# Patient Record
Sex: Male | Born: 1970 | Race: White | Hispanic: No | Smoking: Former smoker
Health system: Southern US, Community
[De-identification: ages and names within clinical notes are randomized; demographics above are authoritative.]

## PROBLEM LIST (undated history)

## (undated) DIAGNOSIS — J309 Allergic rhinitis, unspecified: Secondary | ICD-10-CM

## (undated) DIAGNOSIS — K4090 Unilateral inguinal hernia, without obstruction or gangrene, not specified as recurrent: Secondary | ICD-10-CM

## (undated) DIAGNOSIS — F17200 Nicotine dependence, unspecified, uncomplicated: Secondary | ICD-10-CM

## (undated) DIAGNOSIS — J45909 Unspecified asthma, uncomplicated: Secondary | ICD-10-CM

## (undated) DIAGNOSIS — J40 Bronchitis, not specified as acute or chronic: Secondary | ICD-10-CM

## (undated) DIAGNOSIS — J45990 Exercise induced bronchospasm: Secondary | ICD-10-CM

## (undated) HISTORY — DX: Nicotine dependence, unspecified, uncomplicated: F17.200

## (undated) HISTORY — DX: Unilateral inguinal hernia, without obstruction or gangrene, not specified as recurrent: K40.90

## (undated) HISTORY — DX: Unspecified asthma, uncomplicated: J45.909

## (undated) HISTORY — DX: Allergic rhinitis, unspecified: J30.9

## (undated) HISTORY — DX: Bronchitis, not specified as acute or chronic: J40

## (undated) HISTORY — PX: HERNIA REPAIR: SHX51

## (undated) HISTORY — DX: Exercise induced bronchospasm: J45.990

---

## 1998-03-07 ENCOUNTER — Encounter: Admission: RE | Admit: 1998-03-07 | Discharge: 1998-03-07 | Payer: Self-pay | Admitting: Family Medicine

## 1998-07-13 ENCOUNTER — Encounter: Admission: RE | Admit: 1998-07-13 | Discharge: 1998-07-13 | Payer: Self-pay | Admitting: Family Medicine

## 1999-09-30 ENCOUNTER — Encounter: Admission: RE | Admit: 1999-09-30 | Discharge: 1999-09-30 | Payer: Self-pay | Admitting: Family Medicine

## 1999-09-30 ENCOUNTER — Ambulatory Visit (HOSPITAL_COMMUNITY): Admission: RE | Admit: 1999-09-30 | Discharge: 1999-09-30 | Payer: Self-pay | Admitting: Family Medicine

## 2000-03-02 ENCOUNTER — Encounter: Admission: RE | Admit: 2000-03-02 | Discharge: 2000-03-02 | Payer: Self-pay | Admitting: Family Medicine

## 2000-07-10 ENCOUNTER — Encounter: Admission: RE | Admit: 2000-07-10 | Discharge: 2000-07-10 | Payer: Self-pay | Admitting: Family Medicine

## 2000-08-18 ENCOUNTER — Encounter: Admission: RE | Admit: 2000-08-18 | Discharge: 2000-08-18 | Payer: Self-pay | Admitting: Family Medicine

## 2000-09-04 ENCOUNTER — Emergency Department (HOSPITAL_COMMUNITY): Admission: EM | Admit: 2000-09-04 | Discharge: 2000-09-04 | Payer: Self-pay | Admitting: Emergency Medicine

## 2007-01-21 ENCOUNTER — Emergency Department (HOSPITAL_COMMUNITY): Admission: EM | Admit: 2007-01-21 | Discharge: 2007-01-21 | Payer: Self-pay | Admitting: Emergency Medicine

## 2011-08-20 ENCOUNTER — Encounter (INDEPENDENT_AMBULATORY_CARE_PROVIDER_SITE_OTHER): Payer: Self-pay | Admitting: Surgery

## 2011-08-21 ENCOUNTER — Encounter (INDEPENDENT_AMBULATORY_CARE_PROVIDER_SITE_OTHER): Payer: Self-pay | Admitting: Surgery

## 2011-08-21 ENCOUNTER — Ambulatory Visit (INDEPENDENT_AMBULATORY_CARE_PROVIDER_SITE_OTHER): Payer: 59 | Admitting: Surgery

## 2011-08-21 VITALS — BP 124/86 | HR 60 | Temp 98.6°F | Resp 18 | Ht 69.0 in | Wt 143.6 lb

## 2011-08-21 DIAGNOSIS — K4090 Unilateral inguinal hernia, without obstruction or gangrene, not specified as recurrent: Secondary | ICD-10-CM

## 2011-08-21 DIAGNOSIS — K4020 Bilateral inguinal hernia, without obstruction or gangrene, not specified as recurrent: Secondary | ICD-10-CM | POA: Insufficient documentation

## 2011-08-21 NOTE — Patient Instructions (Signed)
Inguinal Hernia, Adult  Muscles help keep everything in the body in its proper place. But if a weak spot in the muscles develops, something can poke through. That is called a hernia. When this happens in the lower part of the belly (abdomen), it is called an inguinal hernia. (It takes its name from a part of the body in this region called the inguinal canal.) A weak spot in the wall of muscles lets some fat or part of the small intestine bulge through. An inguinal hernia can develop at any age. Men get them more often than women.  CAUSES   In adults, an inguinal hernia develops over time.  · It can be triggered by:  · Suddenly straining the muscles of the lower abdomen.  · Lifting heavy objects.  · Straining to have a bowel movement. Difficult bowel movements (constipation) can lead to this.  · Constant coughing. This may be caused by smoking or lung disease.  · Being overweight.  · Being pregnant.  · Working at a job that requires long periods of standing or heavy lifting.  · Having had an inguinal hernia before.  One type can be an emergency situation. It is called a strangulated inguinal hernia. It develops if part of the small intestine slips through the weak spot and cannot get back into the abdomen. The blood supply can be cut off. If that happens, part of the intestine may die. This situation requires emergency surgery.  SYMPTOMS   Often, a small inguinal hernia has no symptoms. It is found when a healthcare provider does a physical exam. Larger hernias usually have symptoms.   · In adults, symptoms may include:  · A lump in the groin. This is easier to see when the person is standing. It might disappear when lying down.  · In men, a lump in the scrotum.  · Pain or burning in the groin. This occurs especially when lifting, straining or coughing.  · A dull ache or feeling of pressure in the groin.  · Signs of a strangulated hernia can include:  · A bulge in the groin that becomes very painful and tender to the  touch.  · A bulge that turns red or purple.  · Fever, nausea and vomiting.  · Inability to have a bowel movement or to pass gas.  DIAGNOSIS   To decide if you have an inguinal hernia, a healthcare provider will probably do a physical examination.  · This will include asking questions about any symptoms you have noticed.  · The healthcare provider might feel the groin area and ask you to cough. If an inguinal hernia is felt, the healthcare provider may try to slide it back into the abdomen.  · Usually no other tests are needed.  TREATMENT   Treatments can vary. The size of the hernia makes a difference. Options include:  · Watchful waiting. This is often suggested if the hernia is small and you have had no symptoms.  · No medical procedure will be done unless symptoms develop.  · You will need to watch closely for symptoms. If any occur, contact your healthcare provider right away.  · Surgery. This is used if the hernia is larger or you have symptoms.  · Open surgery. This is usually an outpatient procedure (you will not stay overnight in a hospital). An cut (incision) is made through the skin in the groin. The hernia is put back inside the abdomen. The weak area in the muscles is   then repaired by herniorrhaphy or hernioplasty. Herniorrhaphy: in this type of surgery, the weak muscles are sewn back together. Hernioplasty: a patch or mesh is used to close the weak area in the abdominal wall.  · Laparoscopy. In this procedure, a surgeon makes small incisions. A thin tube with a tiny video camera (called a laparoscope) is put into the abdomen. The surgeon repairs the hernia with mesh by looking with the video camera and using two long instruments.  HOME CARE INSTRUCTIONS   · After surgery to repair an inguinal hernia:  · You will need to take pain medicine prescribed by your healthcare provider. Follow all directions carefully.  · You will need to take care of the wound from the incision.  · Your activity will be  restricted for awhile. This will probably include no heavy lifting for several weeks. You also should not do anything too active for a few weeks. When you can return to work will depend on the type of job that you have.  · During "watchful waiting" periods, you should:  · Maintain a healthy weight.  · Eat a diet high in fiber (fruits, vegetables and whole grains).  · Drink plenty of fluids to avoid constipation. This means drinking enough water and other liquids to keep your urine clear or pale yellow.  · Do not lift heavy objects.  · Do not stand for long periods of time.  · Quit smoking. This should keep you from developing a frequent cough.  SEEK MEDICAL CARE IF:   · A bulge develops in your groin area.  · You feel pain, a burning sensation or pressure in the groin. This might be worse if you are lifting or straining.  · You develop a fever of more than 100.5° F (38.1° C).  SEEK IMMEDIATE MEDICAL CARE IF:   · Pain in the groin increases suddenly.  · A bulge in the groin gets bigger suddenly and does not go down.  · For men, there is sudden pain in the scrotum. Or, the size of the scrotum increases.  · A bulge in the groin area becomes red or purple and is painful to touch.  · You have nausea or vomiting that does not go away.  · You feel your heart beating much faster than normal.  · You cannot have a bowel movement or pass gas.  · You develop a fever of more than 102.0° F (38.9° C).  Document Released: 09/21/2008 Document Revised: 04/24/2011 Document Reviewed: 09/21/2008  [employer]® Patient Information ©2012 [employer].

## 2011-08-21 NOTE — Progress Notes (Signed)
Chief Complaint  Patient presents with  . Inguinal Hernia    new pt- eval inguinal hernia - referral by Dr. Carolin Coy    HISTORY: Patient is a 41 year old white male referred by his primary care physician for newly diagnosed left inguinal hernia. This was found on routine physical exam. Patient has been largely asymptomatic. He has never had any signs or symptoms of intestinal obstruction. He may have had mild discomfort after physical activity. Patient has no complaints on the right side. Patient has had no prior abdominal surgery. He presents today for surgical consultation.  Past Medical History  Diagnosis Date  . Exercise induced bronchospasm   . Allergic rhinitis, cause unspecified   . Inguinal hernia without mention of obstruction or gangrene, unilateral or unspecified, (not specified as recurrent)   . Tobacco use disorder      Current Outpatient Prescriptions  Medication Sig Dispense Refill  . fluticasone (FLONASE) 50 MCG/ACT nasal spray daily.      . VENTOLIN HFA 108 (90 BASE) MCG/ACT inhaler Ad lib.         No Known Allergies   History reviewed. No pertinent family history.   History   Social History  . Marital Status: Married    Spouse Name: N/A    Number of Children: N/A  . Years of Education: N/A   Social History Main Topics  . Smoking status: Current Everyday Smoker -- 0.2 packs/day  . Smokeless tobacco: None  . Alcohol Use: No  . Drug Use: No  . Sexually Active:    Other Topics Concern  . None   Social History Narrative  . None     REVIEW OF SYSTEMS - PERTINENT POSITIVES ONLY: No signs or symptoms of intestinal obstruction. No changes in bowel habits. No changes in bladder habits.  EXAM: Filed Vitals:   08/21/11 1151  BP: 124/86  Pulse: 60  Temp: 98.6 F (37 C)  Resp: 18    HEENT: normocephalic; pupils equal and reactive; sclerae clear; dentition good; mucous membranes moist NECK:  symmetric on extension; no palpable anterior  or posterior cervical lymphadenopathy; no supraclavicular masses; no tenderness CHEST: clear to auscultation bilaterally without rales, rhonchi, or wheezes CARDIAC: regular rate and rhythm without significant murmur; peripheral pulses are full ABDOMEN: soft without distension; bowel sounds present; no mass; no hepatosplenomegaly GU:  Normal male without lesions. Testicles normal without masses. Palpation in the right inguinal canal shows a small probably indirect inguinal hernia which augments with cough and Valsalva. It is reducible. It is nontender. On the left side, with palpation in the inguinal canal, there is a small to moderate hernia sac consistent with indirect inguinal hernia. It is reducible. It augments with cough and Valsalva. EXT:  non-tender without edema; no deformity NEURO: no gross focal deficits; no sign of tremor   LABORATORY RESULTS: See [facility] ([facility]) for most recent results  RADIOLOGY RESULTS: See [facility] ([facility]pic) for most recent results  IMPRESSION: Bilateral inguinal hernia, reducible  PLAN: The patient and I discussed the findings above at length. I provided him with written literature regarding open hernia repair and laparoscopic hernia repair. I believe he would be an excellent candidate for bilateral laparoscopic inguinal hernia repair with mesh. I did not perform this procedure. I am going to ask him to return to our practice to see one of my partners she does perform this procedure under regular basis. Patient will review the literature I have provided. He will return for physical examination and discussion  of the procedure in detail.  He is scheduled to see Dr. Avel Peace on April 12th.  Velora Heckler, MD, FACS General & Endocrine Surgery [facility]   Visit Diagnoses: Bilateral inguinal herniae  Primary Care Physician: Elie Confer, MD, MD

## 2011-08-29 ENCOUNTER — Ambulatory Visit (INDEPENDENT_AMBULATORY_CARE_PROVIDER_SITE_OTHER): Payer: 59 | Admitting: General Surgery

## 2011-08-29 ENCOUNTER — Encounter (INDEPENDENT_AMBULATORY_CARE_PROVIDER_SITE_OTHER): Payer: Self-pay | Admitting: General Surgery

## 2011-08-29 VITALS — BP 148/90 | HR 84 | Temp 99.2°F | Resp 16 | Ht 68.0 in | Wt 139.0 lb

## 2011-08-29 DIAGNOSIS — K4020 Bilateral inguinal hernia, without obstruction or gangrene, not specified as recurrent: Secondary | ICD-10-CM

## 2011-08-29 NOTE — Patient Instructions (Signed)
Please call [phone] when you want to schedule your operation.  Ask for a surgery scheduler.

## 2011-08-29 NOTE — Progress Notes (Signed)
Patient ID: Tyler Hawkins, male   DOB: May 31, 1970, 41 y.o.   MRN: [id number]  Chief Complaint  Patient presents with  . Inguinal Hernia    eval BIH    HPI Tyler Hawkins is a 41 y.o. male.   HPI  He has bilateral inguinal hernias (BIH) and was seen earlier this month by Dr. Gerrit Friends.  I am seeing him today to discuss laparoscopic repair with mesh.  He has discomfort in both groin areas when he does a lot of abdominal exercises.  Past Medical History  Diagnosis Date  . Exercise induced bronchospasm   . Allergic rhinitis, cause unspecified   . Inguinal hernia without mention of obstruction or gangrene, unilateral or unspecified, (not specified as recurrent)   . Tobacco use disorder     History reviewed. No pertinent past surgical history.  History reviewed. No pertinent family history.  Social History History  Substance Use Topics  . Smoking status: Current Everyday Smoker -- 0.2 packs/day  . Smokeless tobacco: Not on file  . Alcohol Use: No    No Known Allergies  Current Outpatient Prescriptions  Medication Sig Dispense Refill  . fluticasone (FLONASE) 50 MCG/ACT nasal spray daily.      . VENTOLIN HFA 108 (90 BASE) MCG/ACT inhaler Ad lib.        Review of Systems Review of Systems  Gastrointestinal: Negative.   Genitourinary: Negative.     Blood pressure 148/90, pulse 84, temperature 99.2 F (37.3 C), temperature source Temporal, resp. rate 16, height 5\' 8"  (1.727 m), weight 139 lb (63.05 kg).  Physical Exam Physical Exam  Constitutional: He appears well-developed and well-nourished. No distress.  Cardiovascular: Normal rate and regular rhythm.   Pulmonary/Chest: Effort normal and breath sounds normal.  Abdominal: Soft. He exhibits no distension. There is no tenderness.  Genitourinary:       Bilateral ingunal bulges are present and reduce in the supine position.    Data Reviewed  Dr. Ardine Eng and Dr. Marya Amsler notes.  Assessment    Bilateral  inguinal hernias    Plan    We discussed laparoscopic BIH repair with mesh and he is interested in this.  He wants to call us back when he would like to schedule the operation.  I have explained the procedure, risks, and aftercare of inguinal hernia repair.  Risks include but are not limited to bleeding, infection, wound problems, anesthesia, recurrence, bladder or intestine injury, urinary retention, testicular dysfunction, chronic pain, mesh problems.  He seems to understand and agrees to proceed.         Frisco Cordts J 08/29/2011, 1:58 PM

## 2012-01-28 ENCOUNTER — Ambulatory Visit (INDEPENDENT_AMBULATORY_CARE_PROVIDER_SITE_OTHER): Payer: 59 | Admitting: General Surgery

## 2012-01-28 ENCOUNTER — Encounter (INDEPENDENT_AMBULATORY_CARE_PROVIDER_SITE_OTHER): Payer: Self-pay | Admitting: General Surgery

## 2012-01-28 VITALS — BP 118/72 | HR 68 | Temp 97.4°F | Resp 14 | Ht 68.0 in | Wt 135.2 lb

## 2012-01-28 DIAGNOSIS — K4020 Bilateral inguinal hernia, without obstruction or gangrene, not specified as recurrent: Secondary | ICD-10-CM

## 2012-01-28 NOTE — Progress Notes (Signed)
Subjective:     Patient ID: Tyler Hawkins, male   DOB: 04-25-1971, 41 y.o.   MRN: [id number]  HPI  I saw him back in April and we discussed laparoscopic bilateral inguinal hernia repair with mesh at that time. He was not ready to schedule the surgery at that time. He comes in today wanting to schedule the surgery. He notices that the left inguinal hernia is slightly larger.   Review of Systems     Objective:   Physical Exam Genitourinary-there is a moderate sized reducible left inguinal bulge. There is a smaller right inguinal bulge that is reducible.    Assessment:     Bilateral inguinal hernias.    Plan:     Laparoscopic repair of bilateral inguinal hernias with mesh.  I have explained the procedure, risks, and aftercare of inguinal hernia repair.  Risks include but are not limited to bleeding, infection, wound problems, anesthesia, recurrence, bladder or intestine injury, urinary retention, testicular dysfunction, chronic pain, mesh problems.  He seems to understand and agrees to proceed.

## 2012-01-28 NOTE — Patient Instructions (Signed)
We will schedule your surgery today. 

## 2012-02-24 DIAGNOSIS — K4020 Bilateral inguinal hernia, without obstruction or gangrene, not specified as recurrent: Secondary | ICD-10-CM

## 2012-03-09 ENCOUNTER — Encounter (INDEPENDENT_AMBULATORY_CARE_PROVIDER_SITE_OTHER): Payer: 59 | Admitting: General Surgery

## 2012-03-10 ENCOUNTER — Encounter (INDEPENDENT_AMBULATORY_CARE_PROVIDER_SITE_OTHER): Payer: Self-pay | Admitting: General Surgery

## 2012-03-10 ENCOUNTER — Ambulatory Visit (INDEPENDENT_AMBULATORY_CARE_PROVIDER_SITE_OTHER): Payer: 59 | Admitting: General Surgery

## 2012-03-10 VITALS — BP 114/74 | HR 72 | Temp 98.2°F | Resp 12 | Ht 68.0 in | Wt 133.4 lb

## 2012-03-10 DIAGNOSIS — Z9889 Other specified postprocedural states: Secondary | ICD-10-CM

## 2012-03-10 NOTE — Progress Notes (Signed)
He presents for postop followup after laparoscopic bilateral inguinal hernia repairs with mesh.  Post op pain is improving.  No difficulty voiding or having BMs.  P.E.  ABD:  Soft, incisions clean/dry/intact  GU:  Swelling is minimal, repairs are solid.  Assessment:  Doing well post hernia repair.  Plan:  Continue light activities for 6 weeks postop then slowly start to resume normal activities.   Avoid activities that cause significant discomfort for the long term.  Return visit as needed.

## 2012-03-10 NOTE — Patient Instructions (Signed)
May do light work which includes desk work. May resume your normal activities as tolerated, as we discussed, 6 weeks from the day after surgery.

## 2012-03-18 ENCOUNTER — Telehealth (INDEPENDENT_AMBULATORY_CARE_PROVIDER_SITE_OTHER): Payer: Self-pay | Admitting: General Surgery

## 2012-03-18 NOTE — Telephone Encounter (Signed)
Pt is calling to report some pain on the right side, above the area of surgery.  The pain is described as a "pulling" or "burning" and is transient in nature.  Reassured pt that he is likely feeling the ongoing evolution of scar tissue, that it is indeed transient and he can safely use Aleve or Motrin.  Can also use ice pack or heat for comfort.  He understands to call back for pain that does not resolve or worsens.

## 2012-03-19 ENCOUNTER — Telehealth (INDEPENDENT_AMBULATORY_CARE_PROVIDER_SITE_OTHER): Payer: Self-pay

## 2012-03-19 NOTE — Telephone Encounter (Signed)
Pt complaining of new pain/burning after hernia repair.  Dr. Abbey Chatters advised limited activity and Advil or Aleve for next several days.

## 2015-10-10 ENCOUNTER — Emergency Department (HOSPITAL_BASED_OUTPATIENT_CLINIC_OR_DEPARTMENT_OTHER): Payer: 59

## 2015-10-10 ENCOUNTER — Emergency Department (HOSPITAL_BASED_OUTPATIENT_CLINIC_OR_DEPARTMENT_OTHER)
Admission: EM | Admit: 2015-10-10 | Discharge: 2015-10-10 | Disposition: A | Discharge status: Home / Self Care | Payer: 59 | Attending: Emergency Medicine | Admitting: Emergency Medicine

## 2015-10-10 ENCOUNTER — Encounter (HOSPITAL_BASED_OUTPATIENT_CLINIC_OR_DEPARTMENT_OTHER): Payer: Self-pay | Admitting: *Deleted

## 2015-10-10 DIAGNOSIS — T50905A Adverse effect of unspecified drugs, medicaments and biological substances, initial encounter: Secondary | ICD-10-CM

## 2015-10-10 DIAGNOSIS — R079 Chest pain, unspecified: Secondary | ICD-10-CM | POA: Diagnosis present

## 2015-10-10 DIAGNOSIS — T43615A Adverse effect of caffeine, initial encounter: Secondary | ICD-10-CM | POA: Diagnosis not present

## 2015-10-10 DIAGNOSIS — Z87891 Personal history of nicotine dependence: Secondary | ICD-10-CM | POA: Diagnosis not present

## 2015-10-10 DIAGNOSIS — Z79899 Other long term (current) drug therapy: Secondary | ICD-10-CM | POA: Insufficient documentation

## 2015-10-10 LAB — TROPONIN I: Troponin I: <0.03 ng/mL (ref <0.031)

## 2015-10-10 LAB — BASIC METABOLIC PANEL
Anion gap: 8 (ref 5–15)
BUN: 15 mg/dL (ref 6–20)
CHLORIDE: 104 mmol/L (ref 101–111)
CO2: 26 mmol/L (ref 22–32)
CREATININE: 1.18 mg/dL (ref 0.61–1.24)
Calcium: 8.9 mg/dL (ref 8.9–10.3)
Glucose, Bld: 161 mg/dL — ABNORMAL HIGH (ref 65–99)
POTASSIUM: 3.7 mmol/L (ref 3.5–5.1)
SODIUM: 138 mmol/L (ref 135–145)

## 2015-10-10 LAB — CBC
HEMATOCRIT: 38 % — AB (ref 39.0–52.0)
Hemoglobin: 13.4 g/dL (ref 13.0–17.0)
MCH: 30.5 pg (ref 26.0–34.0)
MCHC: 35.3 g/dL (ref 30.0–36.0)
MCV: 86.4 fL (ref 78.0–100.0)
PLATELETS: 234 10*3/uL (ref 150–400)
RBC: 4.4 MIL/uL (ref 4.22–5.81)
RDW: 12.5 % (ref 11.5–15.5)
WBC: 7.7 10*3/uL (ref 4.0–10.5)

## 2015-10-10 NOTE — ED Provider Notes (Addendum)
CSN: [id number]     Arrival date & time 10/10/15  0054 History   First MD Initiated Contact with Patient 10/10/15 0107     Chief Complaint  Patient presents with  . Chest Pain     (Consider location/radiation/quality/duration/timing/severity/associated sxs/prior Treatment) Patient is a 45 y.o. male presenting with palpitations. The history is provided by the patient.  Palpitations Palpitations quality:  Regular Onset quality:  Sudden Timing:  Constant Progression:  Resolved Chronicity:  New Context: caffeine   Context comment:  And singulair which a new medication for him Relieved by:  Nothing Worsened by:  Nothing Ineffective treatments:  None tried Associated symptoms: no shortness of breath   Associated symptoms comment:  Turned red and hot and felt dry  Risk factors: no OTC sinus medications and no stress   No long car trips or plane trips no swelling in the legs no illicit substances.  Nose felt dry and closed up as well  Past Medical History  Diagnosis Date  . Exercise induced bronchospasm   . Allergic rhinitis, cause unspecified   . Inguinal hernia without mention of obstruction or gangrene, unilateral or unspecified, (not specified as recurrent)   . Tobacco use disorder    Past Surgical History  Procedure Laterality Date  . Hernia repair     History reviewed. No pertinent family history. Social History  Substance Use Topics  . Smoking status: Former Smoker -- 0.25 packs/day  . Smokeless tobacco: Never Used  . Alcohol Use: No    Review of Systems  HENT: Positive for congestion.   Respiratory: Negative for shortness of breath.   Cardiovascular: Positive for palpitations. Negative for leg swelling.  Skin: Positive for color change.  All other systems reviewed and are negative.     Allergies  Review of patient's allergies indicates no known allergies.  Home Medications   Prior to Admission medications   Medication Sig Start Date End Date Taking?  Authorizing Provider  azelastine (ASTELIN) 0.1 % nasal spray Place into both nostrils 2 (two) times daily. Use in each nostril as directed   Yes Historical Provider, MD  montelukast (SINGULAIR) 10 MG tablet Take 10 mg by mouth at bedtime.   Yes Historical Provider, MD  fluticasone (FLONASE) 50 MCG/ACT nasal spray daily. 08/05/11   Historical Provider, MD  VENTOLIN HFA 108 (90 BASE) MCG/ACT inhaler Ad lib. 08/05/11   Historical Provider, MD   There were no vitals taken for this visit. Physical Exam  Constitutional: He is oriented to person, place, and time. He appears well-developed and well-nourished.  HENT:  Head: Normocephalic and atraumatic.  Mouth/Throat: Oropharynx is clear and moist.  Eyes: EOM are normal. Pupils are equal, round, and reactive to light.  Neck: Normal range of motion. Neck supple.  Cardiovascular: Normal rate, regular rhythm and intact distal pulses.   Pulmonary/Chest: Effort normal and breath sounds normal. No respiratory distress. He has no wheezes. He has no rales.  Abdominal: Soft. Bowel sounds are normal. There is no tenderness. There is no rebound and no guarding.  Musculoskeletal: Normal range of motion. He exhibits no edema or tenderness.  Neurological: He is alert and oriented to person, place, and time. He has normal reflexes.  Skin: Skin is warm and dry. He is not diaphoretic. No erythema.    ED Course  Procedures (including critical care time) Labs Review Labs Reviewed  BASIC METABOLIC PANEL - Abnormal; Notable for the following:    Glucose, Bld 161 (*)    All other components  within normal limits  CBC - Abnormal; Notable for the following:    HCT 38.0 (*)    All other components within normal limits  TROPONIN I    Imaging Review Dg Chest 2 View  10/10/2015  CLINICAL DATA:  Acute onset of left-sided chest pain and erythema. Initial encounter. EXAM: CHEST  2 VIEW COMPARISON:  Chest radiograph from 02/16/2007 FINDINGS: The lungs are well-aerated and  clear. There is no evidence of focal opacification, pleural effusion or pneumothorax. The heart is normal in size; the mediastinal contour is within normal limits. No acute osseous abnormalities are seen. IMPRESSION: No acute cardiopulmonary process seen. Electronically Signed   By: Roanna RaiderJeffery  Chang M.D.   On: 10/10/2015 01:35   I have personally reviewed and evaluated these images and lab results as part of my medical decision-making.   EKG Interpretation   Date/Time:  Wednesday Oct 10 2015 01:02:58 EDT Ventricular Rate:  71 PR Interval:  155 QRS Duration: 88 QT Interval:  396 QTC Calculation: 430 R Axis:   -25 Text Interpretation:  Sinus rhythm Confirmed by [facility]  MD, Samual Beals  ([id number]) on 10/10/2015 2:05:07 AM      MDM   Final diagnoses:  Adverse drug experience, initial encounter   Results for orders placed or performed during the hospital encounter of 10/10/15  Basic metabolic panel  Result Value Ref Range   Sodium 138 135 - 145 mmol/L   Potassium 3.7 3.5 - 5.1 mmol/L   Chloride 104 101 - 111 mmol/L   CO2 26 22 - 32 mmol/L   Glucose, Bld 161 (H) 65 - 99 mg/dL   BUN 15 6 - 20 mg/dL   Creatinine, Ser 6.041.18 0.61 - 1.24 mg/dL   Calcium 8.9 8.9 - 54.010.3 mg/dL   GFR calc non Af Amer >60 >60 mL/min   GFR calc Af Amer >60 >60 mL/min   Anion gap 8 5 - 15  CBC  Result Value Ref Range   WBC 7.7 4.0 - 10.5 K/uL   RBC 4.40 4.22 - 5.81 MIL/uL   Hemoglobin 13.4 13.0 - 17.0 g/dL   HCT 98.138.0 (L) 19.139.0 - 47.852.0 %   MCV 86.4 78.0 - 100.0 fL   MCH 30.5 26.0 - 34.0 pg   MCHC 35.3 30.0 - 36.0 g/dL   RDW 29.512.5 62.111.5 - 30.815.5 %   Platelets 234 150 - 400 K/uL  Troponin I  Result Value Ref Range   Troponin I <0.03 <0.031 ng/mL   Dg Chest 2 View  10/10/2015  CLINICAL DATA:  Acute onset of left-sided chest pain and erythema. Initial encounter. EXAM: CHEST  2 VIEW COMPARISON:  Chest radiograph from 02/16/2007 FINDINGS: The lungs are well-aerated and clear. There is no evidence of focal  opacification, pleural effusion or pneumothorax. The heart is normal in size; the mediastinal contour is within normal limits. No acute osseous abnormalities are seen. IMPRESSION: No acute cardiopulmonary process seen. Electronically Signed   By: Roanna RaiderJeffery  Chang M.D.   On: 10/10/2015 01:35   PERC negative wells 0 highly doubt PE.  HEART score is 1.  No signs of ischemia.  Pain lasted seconds.    Observed in the ED.  No signs of being anticholinergic on exam but patient describes classic anticholinergic phenomenon following taking his Singulair.  Stop this medication and discuss this with your doctor.  No caffeine drink lots of fluids. Follow up with your PMD.   Return for CP sob diaphoresis vomiting or sedation.  Patient verbalizes understanding and  agrees to follow up   Gertie Broerman, MD 10/10/15 1610  Cy Blamer, MD 10/10/15 [phone]

## 2015-10-10 NOTE — ED Notes (Signed)
Pt states that he took PM meds and shortly after began having left sided chest pain skin turned red and felt as though it was burning sx started 40 min ago and have subsided at this point

## 2016-02-28 ENCOUNTER — Ambulatory Visit (INDEPENDENT_AMBULATORY_CARE_PROVIDER_SITE_OTHER): Payer: 59 | Admitting: Internal Medicine

## 2016-02-28 ENCOUNTER — Encounter: Payer: Self-pay | Admitting: Internal Medicine

## 2016-02-28 VITALS — BP 132/90 | HR 93 | Ht 68.0 in | Wt 163.0 lb

## 2016-02-28 DIAGNOSIS — R058 Other specified cough: Secondary | ICD-10-CM

## 2016-02-28 DIAGNOSIS — R05 Cough: Secondary | ICD-10-CM | POA: Diagnosis not present

## 2016-02-28 LAB — NITRIC OXIDE: Nitric Oxide: 5

## 2016-02-28 MED ORDER — PREDNISONE 10 MG PO TABS: ORAL_TABLET | ORAL | 0 refills | Status: DC

## 2016-02-28 MED ORDER — PANTOPRAZOLE SODIUM 40 MG PO TBEC: 40.0000 mg | DELAYED_RELEASE_TABLET | Freq: Every day | ORAL | 2 refills | Status: DC

## 2016-02-28 MED ORDER — FAMOTIDINE 20 MG PO TABS
ORAL_TABLET | ORAL | 11 refills | Status: DC
Start: 2016-02-28 — End: 2020-04-10

## 2016-02-28 NOTE — Assessment & Plan Note (Signed)
[place] [facility] eval May 2017 neg > trial of singulair 02/28/2016  - FENO 02/28/2016  =   6 p completing prednisone 109/11/17 and on singulair  - cyclical cough rx 02/28/2016 >>>    The most common causes of chronic cough in immunocompetent adults include the following: upper airway cough syndrome (UACS), previously referred to as postnasal drip syndrome (PNDS), which is caused by variety of rhinosinus conditions; (2) asthma; (3) GERD; (4) chronic bronchitis from cigarette smoking or other inhaled environmental irritants; (5) nonasthmatic eosinophilic bronchitis; and (6) bronchiectasis.   These conditions, singly or in combination, have accounted for up to 94% of the causes of chronic cough in prospective studies.   Other conditions have constituted no >6% of the causes in prospective studies These have included bronchogenic carcinoma, chronic interstitial pneumonia, sarcoidosis, left ventricular failure, ACEI-induced cough, and aspiration from a condition associated with pharyngeal dysfunction.    Chronic cough is often simultaneously caused by more than one condition. A single cause has been found from 38 to 82% of the time, multiple causes from 18 to 62%. Multiply caused cough has been the result of three diseases up to 42% of the time.       Based on hx and exam, this is most likely:  Classic Upper airway cough syndrome, so named because it's frequently impossible to sort out how much is  CR/sinusitis with freq throat clearing (which can be related to primary GERD)   vs  causing  secondary (" extra esophageal")  GERD from wide swings in gastric pressure that occur with throat clearing, often  promoting self use of mint and menthol lozenges that reduce the lower esophageal sphincter tone and exacerbate the problem further in a cyclical fashion.   These are the same pts (now being labeled as having "irritable larynx syndrome" by some cough centers) who not infrequently have a history of having failed to  tolerate ace inhibitors,  dry powder inhalers or biphosphonates or report having atypical reflux symptoms that don't respond to standard doses of PPI , and are easily confused as having aecopd or asthma flares by even experienced allergists/ pulmonologists.   Of the three most common causes of chronic cough, only one (GERD)  can actually cause the other two (asthma and post nasal drip syndrome)  and perpetuate the cylce of cough inducing airway trauma, inflammation, heightened sensitivity to reflux which is prompted by the cough itself via a cyclical mechanism.    This may partially respond to steroids and look like asthma and post nasal drainage but never erradicated completely unless the cough and the secondary reflux are eliminated, preferably both at the same time.  While not intuitively obvious, many patients with chronic low grade reflux do not cough until there is a secondary insult that disturbs the protective epithelial barrier and exposes sensitive nerve endings.  This can be viral or direct physical injury such as with an endotracheal tube.   The point is that once this occurs, it is difficult to eliminate using anything but a maximally effective acid suppression regimen at least in the short run, accompanied by an appropriate diet to address non acid GERD.    For now try heavy cough suppression/ gerd rx/ prn h1 / one more cycle of pred and check sinus ct then f/u prn  Total time devoted to counseling  = 35/30m review case with pt/ discussion of options/alternatives/ personally creating written instructions  in presence of pt  then going over those specific  Instructions directly  with the pt including how to use all of the meds but in particular covering each new medication in detail and the difference between the maintenance/automatic meds and the prns using an action plan format for the latter.

## 2016-02-28 NOTE — Progress Notes (Signed)
Subjective:     Patient ID: Tyler Hawkins, male   DOB: 07/27/1970,    MRN: [id number]  HPI   [age] yowm dx asthma as child but outgrew by school age with nl ex tol after that then around 2013 developed pattern of recurrent  late Sept /oct "sinus/bronchitis" with neg allergy eval by [place] [facility] May 2017 neg atopy/ rx singulair but recurred on Oct 1st 2017 while on singulair so self-referred to pulmonary clinic 02/28/2016     02/28/2016 1st [facility] office visit/ Joycelyn Liska   Chief Complaint  Patient presents with  . Pulmonary Consult    Self Referral, [facility]. [facility] and he stated that it was unclear to them what they saw on his xray, he says that he goes through this every year with mucus in the back of his throat, and coughing, and then turning into bronchitis, using his inhaler more   in past 4 years gets pressure in sinuses then sensatoin of pnds/ typically lasts a week or so and gets better zpak /prednisone and this time some better p rx Oct 1st 2017 p sick x 3 days No  Better p albuterol   Cough is daily / 75%  better p pred/zpak but really only goes away with codeine cough syrup otherwise coughing all night / mucous is scant, white      No obvious day to day or daytime variability or assoc sob unless coughing/ no  excess/ purulent sputum or mucus plugs or hemoptysis or cp or chest tightness, subjective wheeze or overt sinus or hb symptoms. No unusual exp hx or h/o childhood pna/ asthma or knowledge of premature birth.  Sleeping ok only p codeine without nocturnal  or early am exacerbation  of respiratory  c/o's or need for noct saba. Also denies any obvious fluctuation of symptoms with weather or environmental changes or other aggravating or alleviating factors except as outlined above   Current Medications, Allergies, Complete Past Medical History, Past Surgical History, Family History, and Social History were reviewed in [facility] electronic  medical record.  ROS  The following are not active complaints unless bolded sore throat, dysphagia, dental problems, itching, sneezing,  nasal congestion or excess/ purulent secretions, ear ache,   fever, chills, sweats, unintended wt loss, classically pleuritic or exertional cp,  orthopnea pnd or leg swelling, presyncope, palpitations, abdominal pain, anorexia, nausea, vomiting, diarrhea  or change in bowel or bladder habits, change in stools or urine, dysuria,hematuria,  rash, arthralgias, visual complaints, headache, numbness, weakness or ataxia or problems with walking or coordination,  change in mood/affect or memory.        Review of Systems     Objective:   Physical Exam amb wm nad  Wt Readings from Last 3 Encounters:  02/28/16 163 lb (73.9 kg)  03/10/12 133 lb 6.4 oz (60.5 kg)  01/28/12 135 lb 4 oz (61.3 kg)    Vital signs reviewed   HEENT: nl dentition,  Nl external ear canals without cough reflex - oropharynx min cobblestoning s excess pnd a and moderate bilateral non-specific turbinate edema   L > R   NECK :  without JVD/Nodes/TM/ nl carotid upstrokes bilaterally   LUNGS: no acc muscle use,  Nl contour chest which is clear to A and P bilaterally without cough on insp or exp maneuvers   CV:  RRR  no s3 or murmur or increase in P2, no edema   ABD:  soft and nontender  with nl inspiratory excursion in the supine position. No bruits or organomegaly, bowel sounds nl  MS:  Nl gait/ ext warm without deformities, calf tenderness, cyanosis or clubbing No obvious joint restrictions   SKIN: warm and dry without lesions    NEURO:  alert, approp, nl sensorium with  no motor deficits      I personally reviewed images and agree with radiology impression as follows:  CXR:   10/10/15 No acute cardiopulmonary process seen.    Assessment:

## 2016-02-28 NOTE — Patient Instructions (Addendum)
Please see patient coordinator before you leave today  to schedule sinus CT  The key to effective treatment for your cough is eliminating the non-stop cycle of cough you're stuck in long enough to let your airway heal completely and then see if there is anything still making you cough once you stop the cough suppression, but this should take no more than 5 days to figure out  First take delsym two tsp every 12 hours and if not strong enough then switch to cheritussin up to 2 tsp every 4 hours     Prednisone 10 mg take  4 each am x 2 days,   2 each am x 2 days,  1 each am x 2 days and stop (this is to eliminate allergies and inflammation from coughing)  Protonix (pantoprazole) Take 30-60 min before first meal of the day and Pepcid 20 mg one bedtime plus Chlorpheniramine 4 mg x 2 at bedtime (both available over the counter)  until cough is completely gone for at least a week without the need for cough suppression  GERD (REFLUX)  is an extremely common cause of respiratory symptoms, many times with no significant heartburn at all.    It can be treated with medication, but also with lifestyle changes including avoidance of late meals, excessive alcohol, smoking cessation, and avoid fatty foods, chocolate, peppermint, colas, red wine, and acidic juices such as orange juice.  NO MINT OR MENTHOL PRODUCTS SO NO COUGH DROPS   USE HARD CANDY INSTEAD (jolley ranchers or Stover's or Lifesavers (all available in sugarless versions) NO OIL BASED VITAMINS - use powdered substitutes.   If all better tell your friends, if not call me

## 2016-03-06 ENCOUNTER — Ambulatory Visit (INDEPENDENT_AMBULATORY_CARE_PROVIDER_SITE_OTHER)
Admission: RE | Admit: 2016-03-06 | Discharge: 2016-03-06 | Disposition: A | Discharge status: Home / Self Care | Payer: 59 | Source: Ambulatory Visit | Attending: Internal Medicine | Admitting: Internal Medicine

## 2016-03-06 DIAGNOSIS — R05 Cough: Secondary | ICD-10-CM | POA: Diagnosis not present

## 2016-03-06 DIAGNOSIS — R058 Other specified cough: Secondary | ICD-10-CM

## 2016-03-07 NOTE — Progress Notes (Signed)
Spoke with pt and notified of results per Dr. Wert. Pt verbalized understanding and denied any questions. 

## 2016-03-17 ENCOUNTER — Encounter: Payer: Self-pay | Admitting: Internal Medicine

## 2016-05-26 DIAGNOSIS — B079 Viral wart, unspecified: Secondary | ICD-10-CM | POA: Diagnosis not present

## 2016-05-26 DIAGNOSIS — B078 Other viral warts: Secondary | ICD-10-CM | POA: Diagnosis not present

## 2016-05-29 DIAGNOSIS — I10 Essential (primary) hypertension: Secondary | ICD-10-CM | POA: Diagnosis not present

## 2016-06-16 DIAGNOSIS — I10 Essential (primary) hypertension: Secondary | ICD-10-CM | POA: Diagnosis not present

## 2016-06-16 DIAGNOSIS — Z79899 Other long term (current) drug therapy: Secondary | ICD-10-CM | POA: Diagnosis not present

## 2016-12-04 DIAGNOSIS — H5203 Hypermetropia, bilateral: Secondary | ICD-10-CM | POA: Diagnosis not present

## 2016-12-04 DIAGNOSIS — H52223 Regular astigmatism, bilateral: Secondary | ICD-10-CM | POA: Diagnosis not present

## 2017-02-12 DIAGNOSIS — Z23 Encounter for immunization: Secondary | ICD-10-CM | POA: Diagnosis not present

## 2017-02-12 DIAGNOSIS — R1032 Left lower quadrant pain: Secondary | ICD-10-CM | POA: Diagnosis not present

## 2017-02-16 ENCOUNTER — Other Ambulatory Visit: Payer: Self-pay | Admitting: Family Medicine

## 2017-02-16 DIAGNOSIS — R1032 Left lower quadrant pain: Secondary | ICD-10-CM

## 2017-02-18 ENCOUNTER — Ambulatory Visit
Admission: RE | Admit: 2017-02-18 | Discharge: 2017-02-18 | Disposition: A | Discharge status: Home / Self Care | Payer: 59 | Source: Ambulatory Visit | Attending: Family Medicine | Admitting: Family Medicine

## 2017-02-18 DIAGNOSIS — R1032 Left lower quadrant pain: Secondary | ICD-10-CM

## 2017-02-18 DIAGNOSIS — K7689 Other specified diseases of liver: Secondary | ICD-10-CM | POA: Diagnosis not present

## 2017-02-18 MED ORDER — IOPAMIDOL (ISOVUE-300) INJECTION 61%
100.0000 mL | Freq: Once | INTRAVENOUS | Status: AC | PRN
  Administered 2017-02-18: 100 mL via INTRAVENOUS

## 2017-05-14 DIAGNOSIS — Z20828 Contact with and (suspected) exposure to other viral communicable diseases: Secondary | ICD-10-CM | POA: Diagnosis not present

## 2017-06-10 DIAGNOSIS — J018 Other acute sinusitis: Secondary | ICD-10-CM | POA: Diagnosis not present

## 2017-06-26 DIAGNOSIS — Z23 Encounter for immunization: Secondary | ICD-10-CM | POA: Diagnosis not present

## 2017-06-26 DIAGNOSIS — I10 Essential (primary) hypertension: Secondary | ICD-10-CM | POA: Diagnosis not present

## 2017-06-26 DIAGNOSIS — Z0000 Encounter for general adult medical examination without abnormal findings: Secondary | ICD-10-CM | POA: Diagnosis not present

## 2017-07-05 IMAGING — CR DG CHEST 2V
2 series · 2 of 2 positions shown · non-contrast
Comparison: Chest radiograph from 02/16/2007

CLINICAL DATA: Acute onset of left-sided chest pain and erythema.
Initial encounter.

EXAM:
CHEST  2 VIEW

[w chest pa]
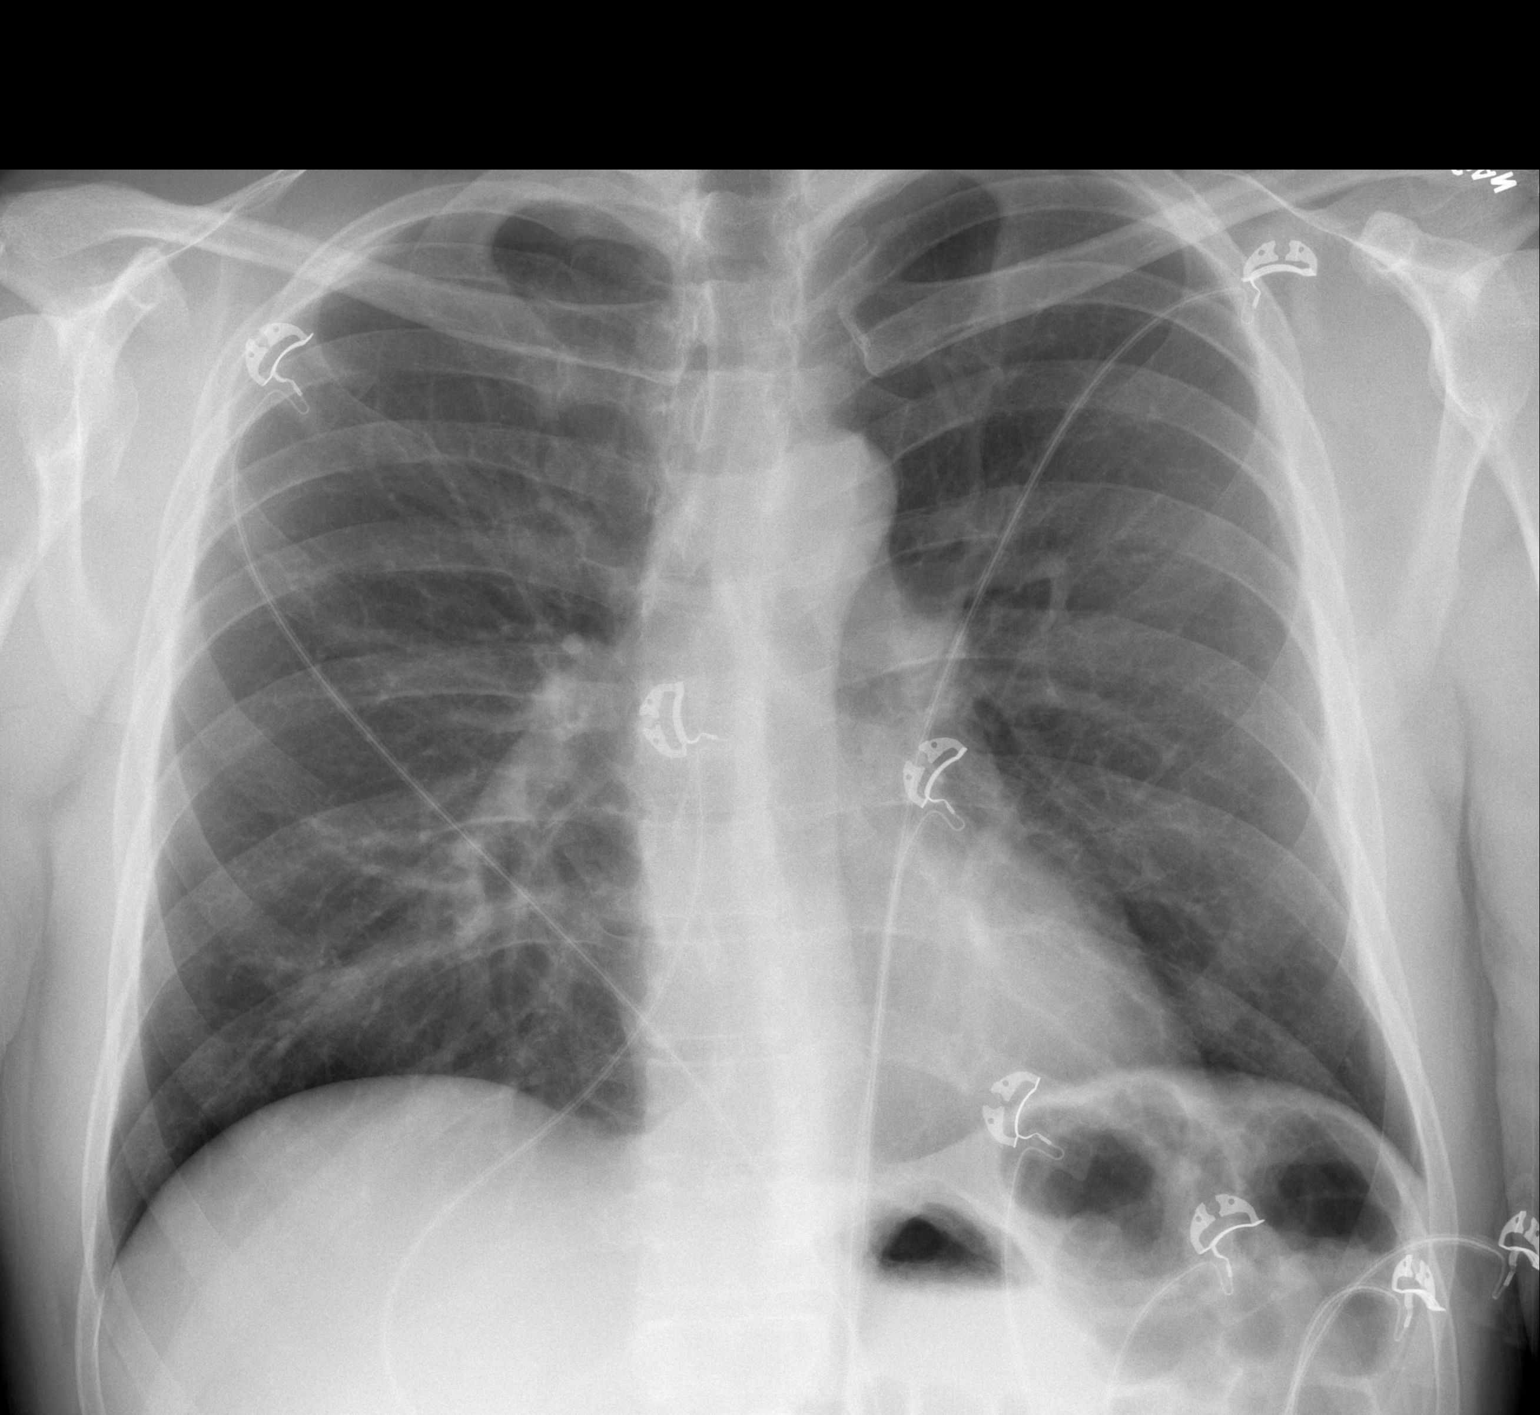

[w chest lat]
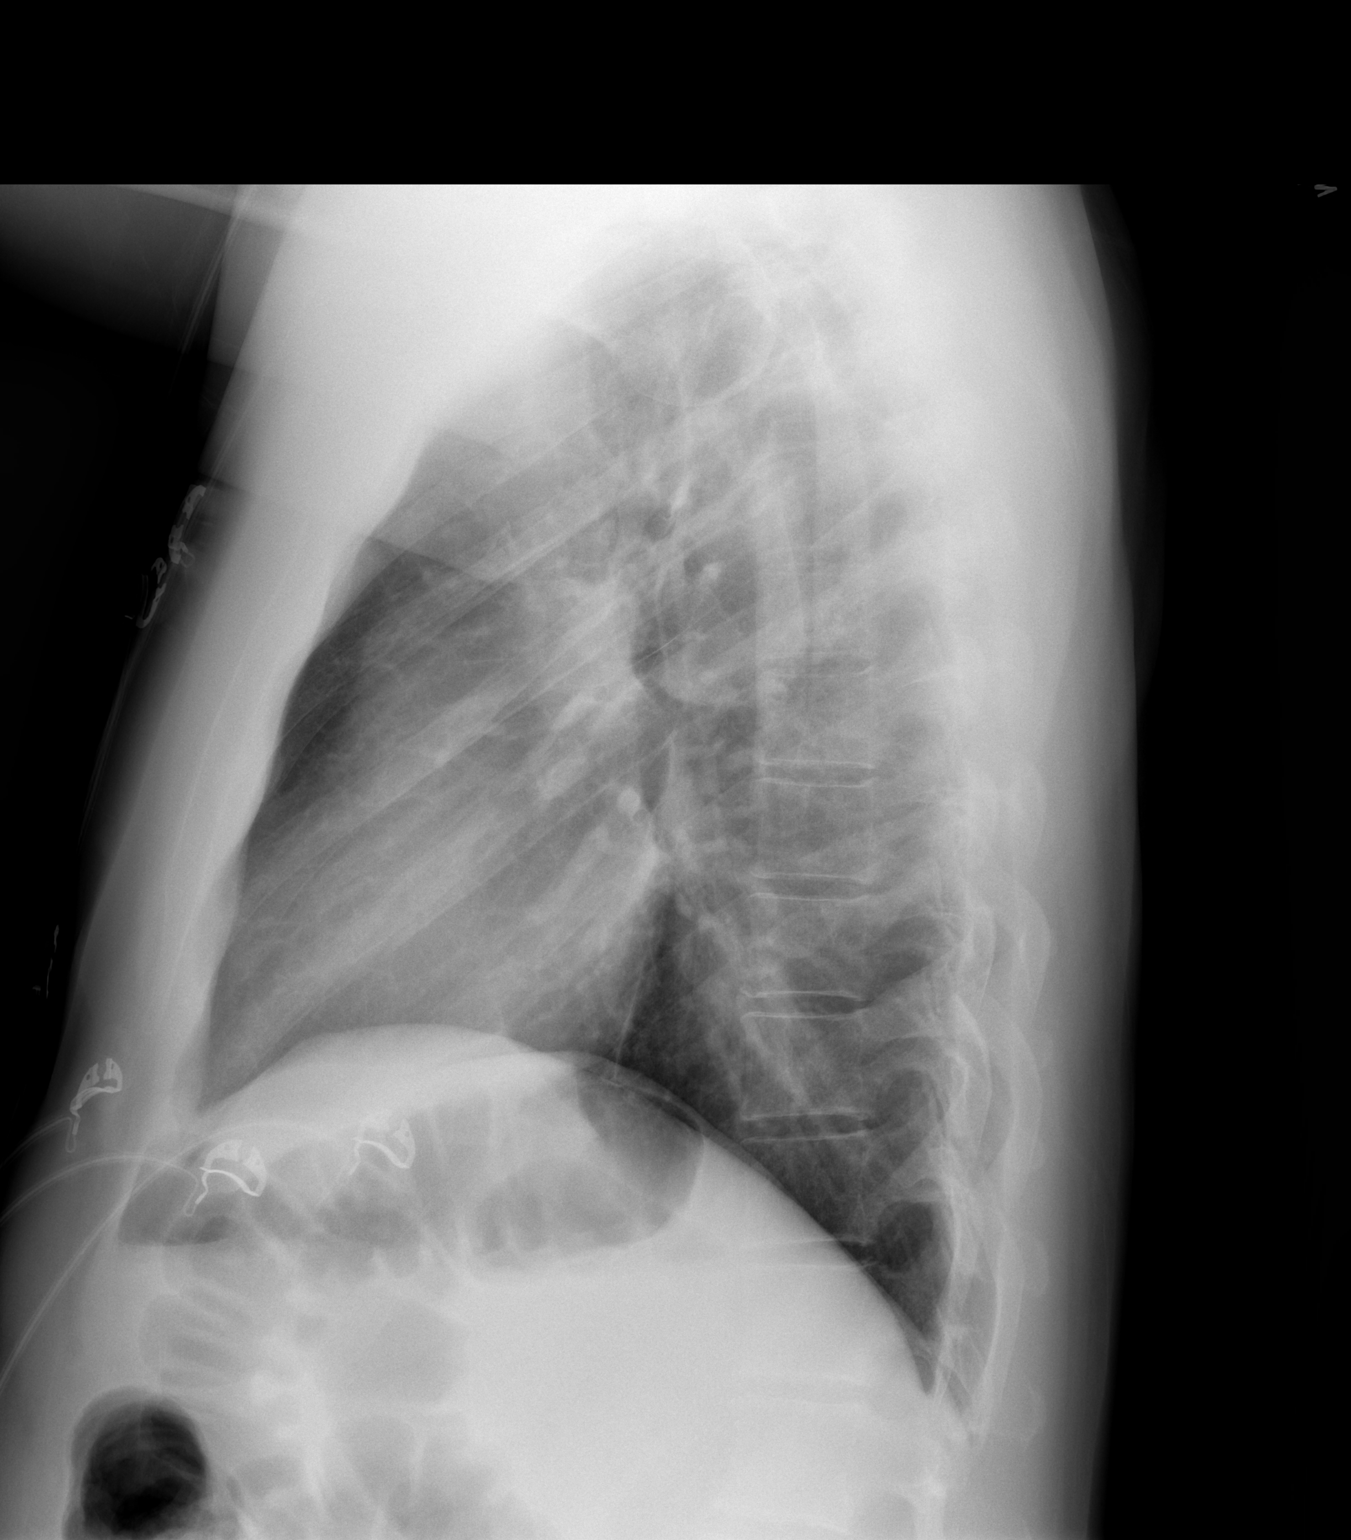

[2 of 2 positions shown; findings below may reference images not displayed]

FINDINGS: The lungs are well-aerated and clear. There is no evidence of focal
opacification, pleural effusion or pneumothorax.

The heart is normal in size; the mediastinal contour is within
normal limits. No acute osseous abnormalities are seen.
IMPRESSION: No acute cardiopulmonary process seen.

## 2017-07-16 ENCOUNTER — Other Ambulatory Visit: Payer: Self-pay | Admitting: Internal Medicine

## 2017-07-16 DIAGNOSIS — R05 Cough: Secondary | ICD-10-CM

## 2017-07-16 DIAGNOSIS — R058 Other specified cough: Secondary | ICD-10-CM

## 2017-09-02 DIAGNOSIS — M9901 Segmental and somatic dysfunction of cervical region: Secondary | ICD-10-CM | POA: Diagnosis not present

## 2017-09-02 DIAGNOSIS — M9902 Segmental and somatic dysfunction of thoracic region: Secondary | ICD-10-CM | POA: Diagnosis not present

## 2017-09-02 DIAGNOSIS — M9903 Segmental and somatic dysfunction of lumbar region: Secondary | ICD-10-CM | POA: Diagnosis not present

## 2017-11-03 DIAGNOSIS — J209 Acute bronchitis, unspecified: Secondary | ICD-10-CM | POA: Diagnosis not present

## 2018-05-04 DIAGNOSIS — E7800 Pure hypercholesterolemia, unspecified: Secondary | ICD-10-CM | POA: Diagnosis not present

## 2018-11-14 IMAGING — CT CT ABD-PELV W/ CM
3 of 5 series · 13 of 36 positions shown, 19 images · IV contrast (OMNI 300/WATER & [ID] ISOVUE 300)
Comparison: None.

CLINICAL DATA: Left lower quadrant pain for several months

EXAM:
CT ABDOMEN AND PELVIS WITH CONTRAST
TECHNIQUE: Multidetector CT imaging of the abdomen and pelvis was performed
using the standard protocol following bolus administration of
intravenous contrast.
CONTRAST:  100mL OVAGSZ-TNN IOPAMIDOL (OVAGSZ-TNN) INJECTION 61%

[Series 3: abd/pelvis with · axial · 0.64mm/px · z∈[-352,+14]mm · 8 of 95 slices shown, 13 images]
[im 11/95  soft-tissue]
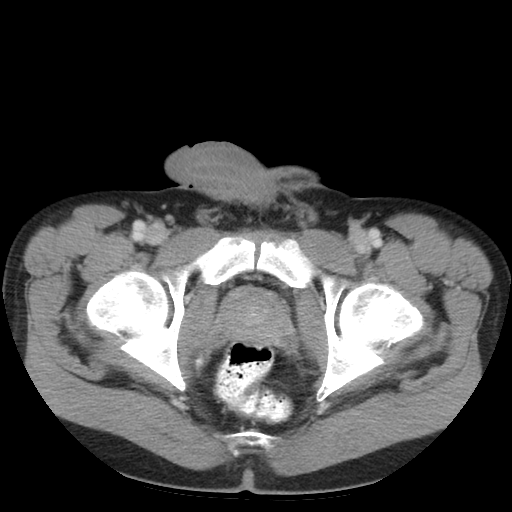
[im 11/95  bone]
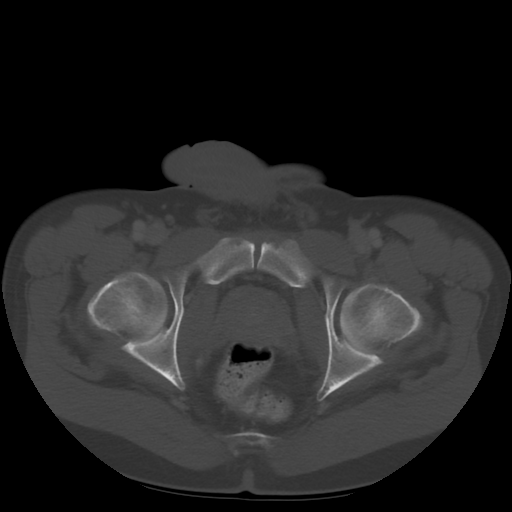
[im 21/95  soft-tissue]
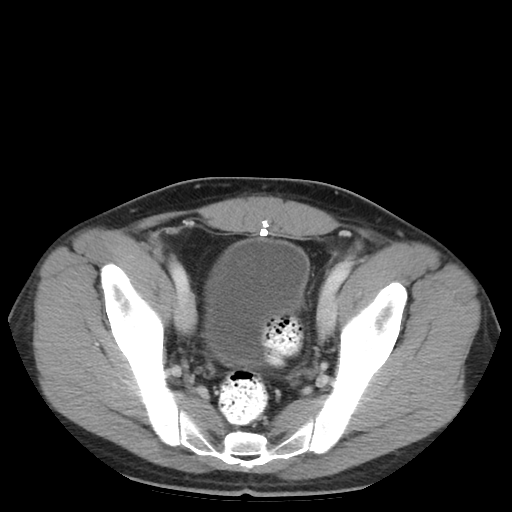
[im 32/95  soft-tissue]
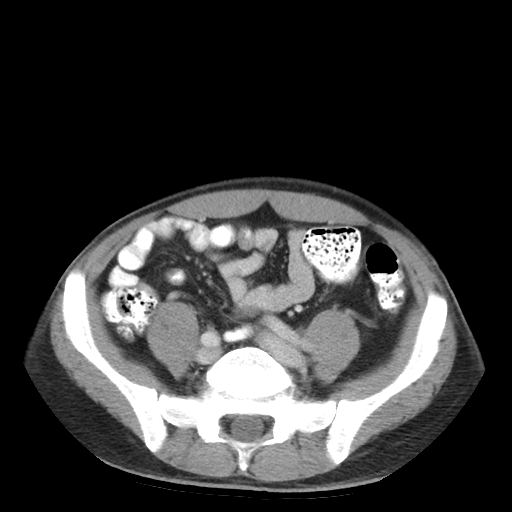
[im 42/95  soft-tissue]
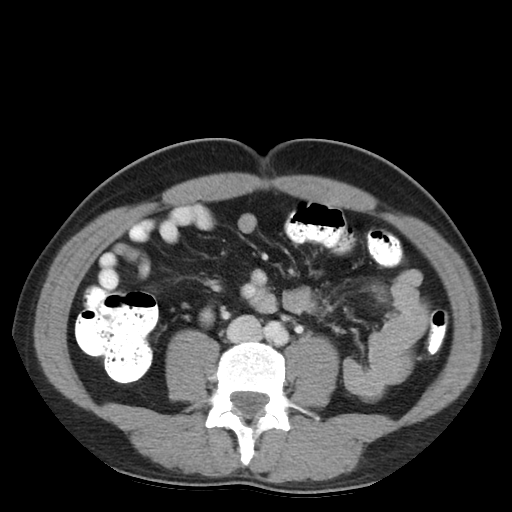
[im 53/95  soft-tissue]
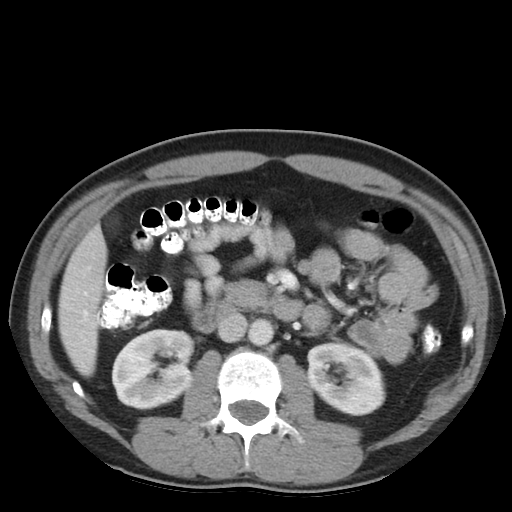
[im 53/95  lung]
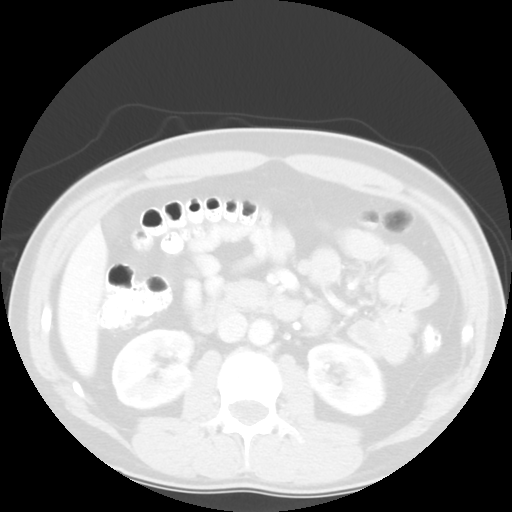
[im 63/95  soft-tissue]
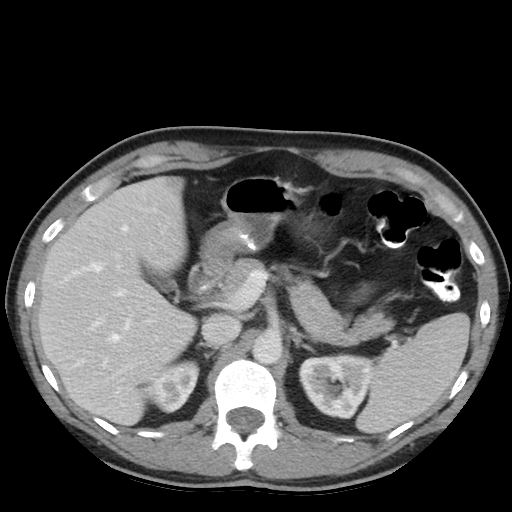
[im 63/95  lung]
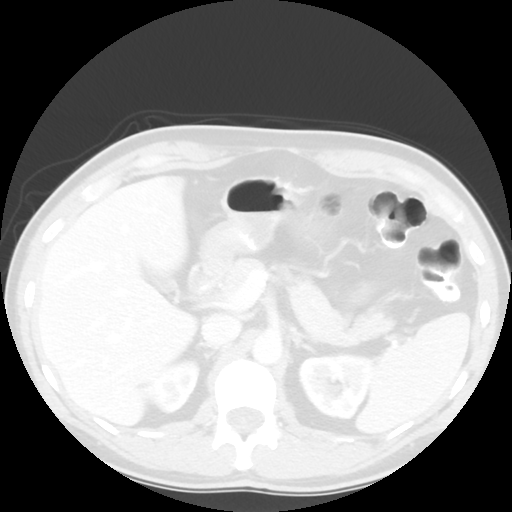
[im 74/95  soft-tissue]
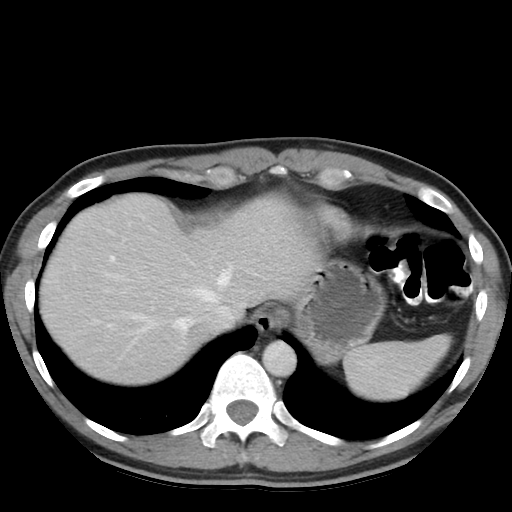
[im 74/95  lung]
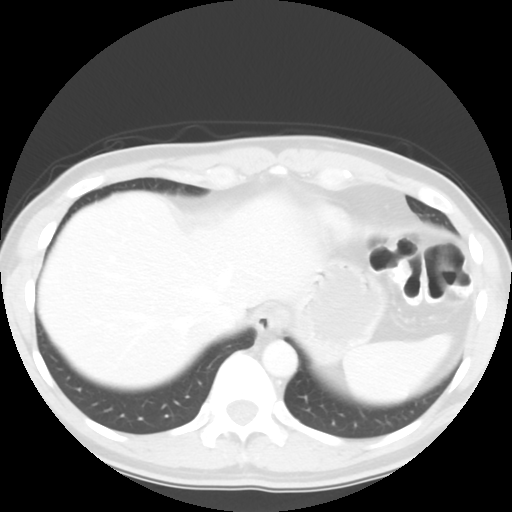
[im 84/95  soft-tissue]
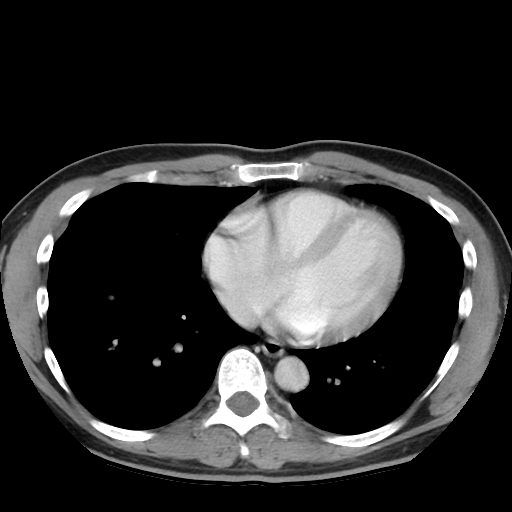
[im 84/95  lung]
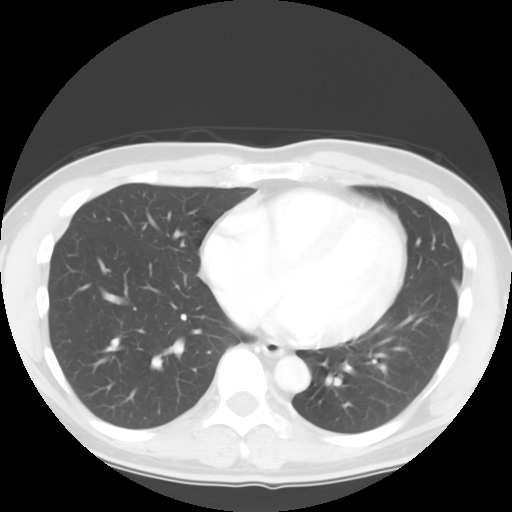

[Series 601: coronal body · coronal · 0.96mm/px · 1 of 100 slices shown, 2 images]
[im 34/100  soft-tissue]
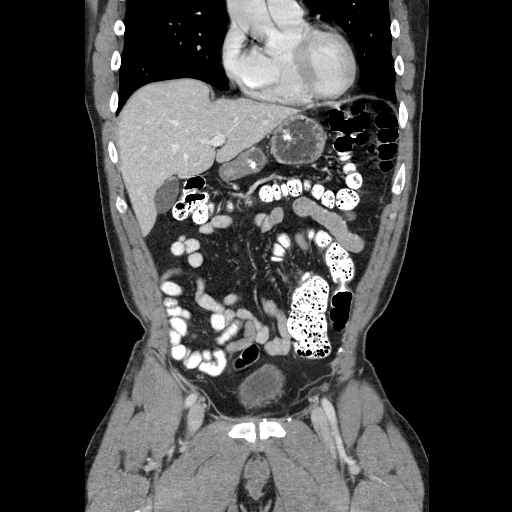
[im 34/100  bone]
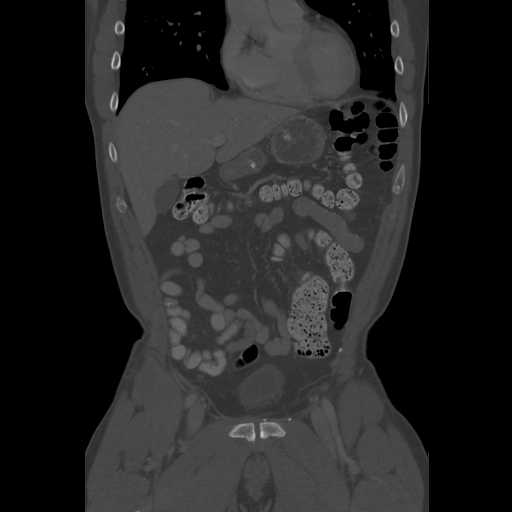

[Series 602: sagittal body · sagittal · 0.96mm/px · 4 of 133 slices shown]
[im 10/133  soft-tissue]
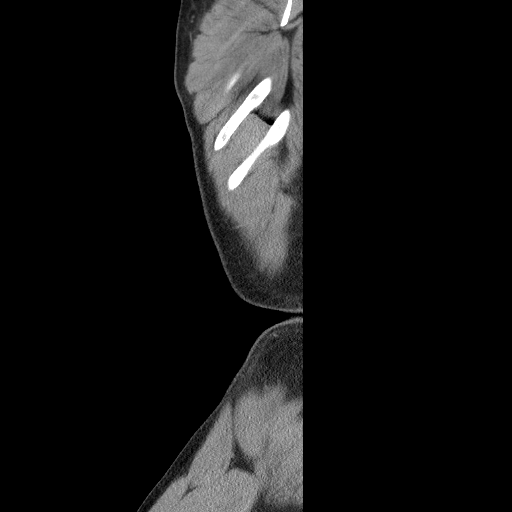
[im 29/133  soft-tissue]
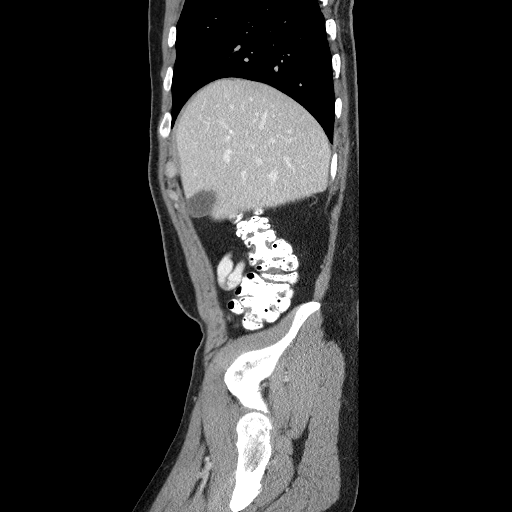
[im 48/133  soft-tissue]
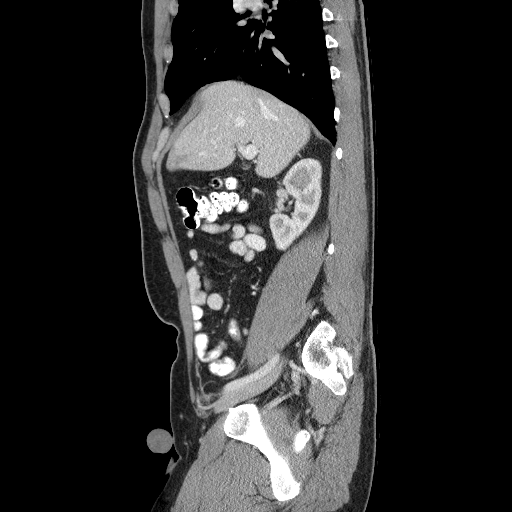
[im 57/133  soft-tissue]
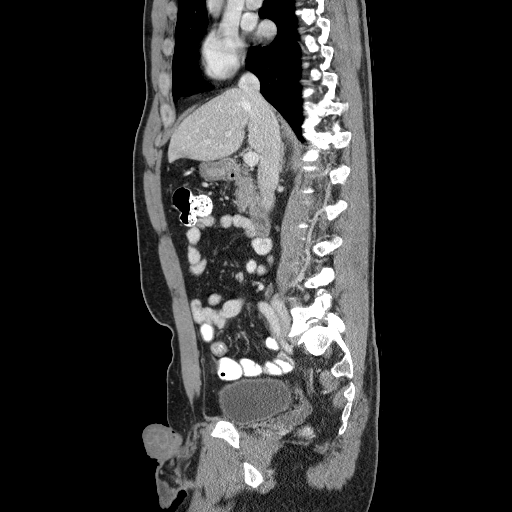

[13 of 36 positions shown; findings below may reference images not displayed]

FINDINGS: Lower chest: No acute abnormality.

Hepatobiliary: Tiny hypodensity is noted in the medial right lobe
best seen on image number 25 series 3 consistent with a small cyst.
No gallstones, gallbladder wall thickening, or biliary dilatation.

Pancreas: Unremarkable. No pancreatic ductal dilatation or
surrounding inflammatory changes.

Spleen: Normal in size without focal abnormality.

Adrenals/Urinary Tract: The adrenal glands are within normal limits.
The kidneys are well visualized bilaterally without renal calculi or
obstructive changes. Delayed images appear within normal limits. The
bladder is partially distended.

Stomach/Bowel: Stomach is within normal limits. Appendix appears
normal. No evidence of bowel wall thickening, distention, or
inflammatory changes.

Vascular/Lymphatic: No significant vascular findings are present. No
enlarged abdominal or pelvic lymph nodes.

Reproductive: Prostate is unremarkable.

Other: No abdominal wall hernia or abnormality. Changes consistent
with prior hernia repair are noted. No abdominopelvic ascites.

Musculoskeletal: Bilateral L5 pars defects are noted. Mild
anterolisthesis of L5 on S1 is noted with disc space narrowing.
IMPRESSION: Small hepatic cyst.

Chronic changes in the lower lumbar spine.

No acute abnormality is noted.

## 2020-02-11 ENCOUNTER — Emergency Department (HOSPITAL_COMMUNITY): Payer: 59

## 2020-02-11 ENCOUNTER — Emergency Department (HOSPITAL_COMMUNITY)
Admission: EM | Admit: 2020-02-11 | Discharge: 2020-02-12 | Disposition: A | Discharge status: Home / Self Care | Payer: 59 | Attending: Emergency Medicine | Admitting: Emergency Medicine

## 2020-02-11 ENCOUNTER — Other Ambulatory Visit: Payer: Self-pay

## 2020-02-11 ENCOUNTER — Encounter (HOSPITAL_COMMUNITY): Payer: Self-pay | Admitting: Emergency Medicine

## 2020-02-11 DIAGNOSIS — Z87891 Personal history of nicotine dependence: Secondary | ICD-10-CM | POA: Insufficient documentation

## 2020-02-11 DIAGNOSIS — R0789 Other chest pain: Secondary | ICD-10-CM | POA: Diagnosis present

## 2020-02-11 DIAGNOSIS — R079 Chest pain, unspecified: Secondary | ICD-10-CM

## 2020-02-11 LAB — CBC
HCT: 38.2 % — ABNORMAL LOW (ref 39.0–52.0)
Hemoglobin: 13.2 g/dL (ref 13.0–17.0)
MCH: 30.1 pg (ref 26.0–34.0)
MCHC: 34.6 g/dL (ref 30.0–36.0)
MCV: 87 fL (ref 80.0–100.0)
Platelets: 257 10*3/uL (ref 150–400)
RBC: 4.39 MIL/uL (ref 4.22–5.81)
RDW: 12.3 % (ref 11.5–15.5)
WBC: 8.7 10*3/uL (ref 4.0–10.5)
nRBC: 0 % (ref 0.0–0.2)

## 2020-02-11 LAB — BASIC METABOLIC PANEL
Anion gap: 10 (ref 5–15)
BUN: 20 mg/dL (ref 6–20)
CO2: 28 mmol/L (ref 22–32)
Calcium: 8.9 mg/dL (ref 8.9–10.3)
Chloride: 99 mmol/L (ref 98–111)
Creatinine, Ser: 1.27 mg/dL — ABNORMAL HIGH (ref 0.61–1.24)
GFR calc Af Amer: >60 mL/min (ref >60)
GFR calc non Af Amer: >60 mL/min (ref >60)
Glucose, Bld: 216 mg/dL — ABNORMAL HIGH (ref 70–99)
Potassium: 3.7 mmol/L (ref 3.5–5.1)
Sodium: 137 mmol/L (ref 135–145)

## 2020-02-11 LAB — TROPONIN I (HIGH SENSITIVITY): Troponin I (High Sensitivity): 6 ng/L (ref <18)

## 2020-02-11 MED ORDER — ASPIRIN 81 MG PO CHEW
324.0000 mg | CHEWABLE_TABLET | Freq: Once | ORAL | Status: AC
  Administered 2020-02-11: 324 mg via ORAL
  Filled 2020-02-11: qty 4

## 2020-02-11 NOTE — ED Triage Notes (Signed)
Patient arrives complaining of a sudden onset of chest pain. Patient states he was sitting on the couch watching TV when he suddenly got hot, his chest got tight and he started to have pain. Patient states he felt like he was going to pass out so he laid on the floor. States his vision started to "close in." Patient denies other symptoms, no heart history.

## 2020-02-11 NOTE — ED Provider Notes (Signed)
[facility]-EMERGENCY DEPT Provider Note   CSN: [id number] Arrival date & time: 02/11/20  2125     History Chief Complaint  Patient presents with   Chest Pain    Tyler Hawkins is a 49 y.o. male.  Patient presents to the emergency department with a chief complaint of chest pain.  He states that he was sitting on his couch watching TV this evening when he began experiencing chest pain and tightness.  He states that it lasted for approximately 20 minutes.  He states that has now resolved.  He felt flushed.  He did not have radiating pain.  He has had this happen 1 time before in the past.  He states that he felt like he was going to pass out.  He denies any family history of early heart disease.  Denies any treatments prior to arrival.  Denies history of PE.  The history is provided by the patient. No language interpreter was used.       Past Medical History:  Diagnosis Date   Allergic rhinitis, cause unspecified    Exercise induced bronchospasm    Inguinal hernia without mention of obstruction or gangrene, unilateral or unspecified, (not specified as recurrent)    Tobacco use disorder     Patient Active Problem List   Diagnosis Date Noted   Upper airway cough syndrome 02/28/2016   Bilateral inguinal hernia 08/21/2011    Past Surgical History:  Procedure Laterality Date   HERNIA REPAIR         No family history on file.  Social History   Tobacco Use   Smoking status: Former Smoker    Packs/day: 0.25    Types: Cigarettes    Quit date: 09/23/2011    Years since quitting: 8.3   Smokeless tobacco: Never Used  Substance Use Topics   Alcohol use: No   Drug use: No    Home Medications Prior to Admission medications   Medication Sig Start Date End Date Taking? Authorizing Provider  azelastine (ASTELIN) 0.1 % nasal spray Place into both nostrils 2 (two) times daily. Use in each nostril as directed    [provider]   famotidine (PEPCID) 20 MG tablet One at bedtime 02/28/16   Nyoka Cowden, MD  pantoprazole (PROTONIX) 40 MG tablet Take 1 tablet (40 mg total) by mouth daily. Take 30-60 min before first meal of the day 02/28/16   Nyoka Cowden, MD  predniSONE (DELTASONE) 10 MG tablet Take  4 each am x 2 days,   2 each am x 2 days,  1 each am x 2 days and stop 02/28/16   Nyoka Cowden, MD  VENTOLIN HFA 108 (90 BASE) MCG/ACT inhaler Ad lib. 08/05/11   [provider]    Allergies    Patient has no known allergies.  Review of Systems   Review of Systems  All other systems reviewed and are negative.   Physical Exam Updated Vital Signs BP (!) 154/89 (BP Location: Left Arm)    Pulse 77    Temp 98.1 F (36.7 C) (Oral)    Resp 20    Ht 5\' 8"  (1.727 m)    Wt 74.8 kg    SpO2 100%    BMI 25.09 kg/m   Physical Exam Vitals and nursing note reviewed.  Constitutional:      Appearance: He is well-developed.  HENT:     Head: Normocephalic and atraumatic.  Eyes:     Conjunctiva/sclera: Conjunctivae normal.  Cardiovascular:     Rate and Rhythm: Normal rate and regular rhythm.     Heart sounds: No murmur heard.   Pulmonary:     Effort: Pulmonary effort is normal. No respiratory distress.     Breath sounds: Normal breath sounds.  Abdominal:     Palpations: Abdomen is soft.     Tenderness: There is no abdominal tenderness.  Musculoskeletal:     Cervical back: Neck supple.  Skin:    General: Skin is warm and dry.  Neurological:     Mental Status: He is alert.     ED Results / Procedures / Treatments   Labs (all labs ordered are listed, but only abnormal results are displayed) Labs Reviewed  BASIC METABOLIC PANEL - Abnormal; Notable for the following components:      Result Value   Glucose, Bld 216 (*)    Creatinine, Ser 1.27 (*)    All other components within normal limits  CBC - Abnormal; Notable for the following components:   HCT 38.2 (*)    All other components within normal  limits  TROPONIN I (HIGH SENSITIVITY)  TROPONIN I (HIGH SENSITIVITY)    EKG None  Radiology No results found.  Procedures Procedures (including critical care time)  Medications Ordered in ED Medications  aspirin chewable tablet 324 mg (has no administration in time range)    ED Course  I have reviewed the triage vital signs and the nursing notes.  Pertinent labs & imaging results that were available during my care of the patient were reviewed by me and considered in my medical decision making (see chart for details).    MDM Rules/Calculators/A&P                          This patient complains of chest pain, this involves an extensive number of treatment options, and is a complaint that carries with it a high risk of complications and morbidity.    Differential Dx ACS, GERD, PE, anxiety  Pertinent Labs I ordered, reviewed, and interpreted labs, which included CBC, BMP, and troponin.  Troponins are 6 x2, creatinine mildly increased at 1.27, glucose 216.  Encourage patient to follow-up with his doctor regarding these labs.  No leukocytosis.  Imaging Interpretation I ordered imaging studies which included chest x-ray.  I independently visualized and interpreted the chest x-ray, which showed no active cardiopulmonary.   Medications I ordered medication aspirin for chest pain.  Reassessments After the interventions stated above, I reevaluated the patient and found and remains chest pain-free.  Consultants None.  Plan Discharged home with PCP follow-up.  Heart score is 3.    Final Clinical Impression(s) / ED Diagnoses Final diagnoses:  Chest pain, unspecified type    Rx / DC Orders ED Discharge Orders    None       Roxy Horseman, PA-C 02/12/20 0216    Shon Baton, MD 02/12/20 [phone]

## 2020-02-12 LAB — TROPONIN I (HIGH SENSITIVITY): Troponin I (High Sensitivity): 6 ng/L (ref <18)

## 2020-04-10 ENCOUNTER — Encounter: Payer: Self-pay | Admitting: Cardiology

## 2020-04-10 ENCOUNTER — Other Ambulatory Visit: Payer: Self-pay

## 2020-04-10 ENCOUNTER — Ambulatory Visit: Payer: 59 | Admitting: Cardiology

## 2020-04-10 VITALS — BP 122/80 | HR 89 | Ht 68.0 in | Wt 162.0 lb

## 2020-04-10 DIAGNOSIS — R55 Syncope and collapse: Secondary | ICD-10-CM

## 2020-04-10 NOTE — Patient Instructions (Signed)
Medication Instructions:  Please discontinue your Losartan.  Continue all other medications as listed.  *If you need a refill on your cardiac medications before your next appointment, please call your pharmacy*  Testing/Procedures: Your physician has requested that you have an echocardiogram. Echocardiography is a painless test that uses sound waves to create images of your heart. It provides your doctor with information about the size and shape of your heart and how well your heart's chambers and valves are working. This procedure takes approximately one hour. There are no restrictions for this procedure.  Follow-Up: At [facility], you and your health needs are our priority.  As part of our continuing mission to provide you with exceptional heart care, we have created designated Provider Care Teams.  These Care Teams include your primary Cardiologist (physician) and Advanced Practice Providers (APPs -  Physician Assistants and Nurse Practitioners) who all work together to provide you with the care you need, when you need it.  We recommend signing up for the patient portal called "MyChart".  Sign up information is provided on this After Visit Summary.  MyChart is used to connect with patients for Virtual Visits (Telemedicine).  Patients are able to view lab/test results, encounter notes, upcoming appointments, etc.  Non-urgent messages can be sent to your provider as well.   To learn more about what you can do with MyChart, go to [email or web address].    Your next appointment:   3 month(s)  The format for your next appointment:   In Person  Provider:   Donato Schultz, MD   Thank you for choosing [facility]!!     Vaso-vagal near syncope.  Please try to stay well hydrated.

## 2020-04-10 NOTE — Progress Notes (Signed)
Cardiology Office Note:    Date:  04/10/2020   ID:  Tyler Hawkins, DOB 12-17-1970, MRN [id number]  PCP:  Tyler Palmer, MD  [facility] Cardiologist:  No primary care provider on file.  [facility] Electrophysiologist:  None   Referring MD: Tyler Palmer, MD     History of Present Illness:    Tyler Hawkins is a 49 y.o. male here for the evaluation of tachycardia at the request of Dr. Paulino Rily.  In review of outside records, he had an episode on Saturday the 13th where he started taking his blood pressure. Start at 111/70 with a pulse of 106 and dropped as low as 76/49 with a pulse of 145.  He went to the couch laid down with his feet up.  Lasted about 40 minutes and the blood pressure came back up. He felt dizzy/hot and started to itch no headache. Had BM sensation. Drank water. Tinglings in arm. Hands itchy.   This also occurred a previous Saturday night when he was watching TV not doing anything where he had flushing palpitations, dizziness itching in the hands ears feeling warm. He went to the [facility], ER. There he was evaluated. Troponin was normal. 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 Did not feel particularly anxious at the time.  He works for the [employer], [employer].  Single father.  2 times prior to [facility] minor, flushed red flushed.   Been on losartan 25 since 2018 QHS. No weight changes.   It appears that he was on losartan 25 mg a day. Metanephrines and normetanephrine's were normal. TSH normal. Liver function is normal. Renal function normal.  Stopped running. Feels good after. No nausea. No excessive sweats.   Other than mom no family history. No early heart history with mother.   Past Medical History:  Diagnosis Date   Allergic rhinitis, cause unspecified    Exercise induced bronchospasm    Inguinal hernia without mention of obstruction or gangrene, unilateral or unspecified, (not specified as recurrent)    Tobacco  use disorder     Past Surgical History:  Procedure Laterality Date   HERNIA REPAIR      Current Medications: Current Meds  Medication Sig   fluticasone (FLONASE) 50 MCG/ACT nasal spray as needed.   montelukast (SINGULAIR) 10 MG tablet Take 10 mg by mouth daily.   pantoprazole (PROTONIX) 40 MG tablet Take 1 tablet (40 mg total) by mouth daily. Take 30-60 min before first meal of the day   [DISCONTINUED] losartan (COZAAR) 25 MG tablet Take 25 mg by mouth daily.     Allergies:   Patient has no known allergies.   Social History   Socioeconomic History   Marital status: Married    Spouse name: Not on file   Number of children: Not on file   Years of education: Not on file   Highest education level: Not on file  Occupational History   Not on file  Tobacco Use   Smoking status: Former Smoker    Packs/day: 0.25    Types: Cigarettes    Quit date: 09/23/2011    Years since quitting: 8.5   Smokeless tobacco: Never Used  Substance and Sexual Activity   Alcohol use: No   Drug use: No   Sexual activity: Not on file  Other Topics Concern   Not on file  Social History Narrative   Not on file   Social Determinants of Health   Financial Resource Strain:  Difficulty of Paying Living Expenses: Not on file  Food Insecurity:    Worried About Running Out of Food in the Last Year: Not on file   Ran Out of Food in the Last Year: Not on file  Transportation Needs:    Lack of Transportation (Medical): Not on file   Lack of Transportation (Non-Medical): Not on file  Physical Activity:    Days of Exercise per Week: Not on file   Minutes of Exercise per Session: Not on file  Stress:    Feeling of Stress : Not on file  Social Connections:    Frequency of Communication with Friends and Family: Not on file   Frequency of Social Gatherings with Friends and Family: Not on file   Attends Religious Services: Not on file   Active Member of Clubs or  Organizations: Not on file   Attends Banker Meetings: Not on file   Marital Status: Not on file     ROS:   Please see the history of present illness.    Denies any fevers chills nausea vomiting all other systems reviewed and are negative.  EKGs/Labs/Other Studies Reviewed:    The following studies were reviewed today: Prior CT of abdomen in 2018 personally reviewed-no evidence of coronary artery calcification no atherosclerosis of the aorta.  Showed him images.  Personally reviewed and interpreted.  Notes from outside office reviewed.  Notes from ER visit reviewed.  EKG: Sinus rhythm with left axis deviation from [facility], ER visit  Recent Labs: 02/11/2020: BUN 20; Creatinine, Ser 1.27; Hemoglobin 13.2; Platelets 257; Potassium 3.7; Sodium 137  Recent Lipid Panel No results found for: CHOL, TRIG, HDL, CHOLHDL, VLDL, LDLCALC, LDLDIRECT   Risk Assessment/Calculations:      Physical Exam:    VS:  BP 122/80    Pulse 89    Ht 5\' 8"  (1.727 m)    Wt 162 lb (73.5 kg)    SpO2 98%    BMI 24.63 kg/m     Wt Readings from Last 3 Encounters:  04/10/20 162 lb (73.5 kg)  02/11/20 165 lb (74.8 kg)  02/28/16 163 lb (73.9 kg)     GEN:  Well nourished, well developed in no acute distress HEENT: Normal NECK: No JVD; No carotid bruits LYMPHATICS: No lymphadenopathy CARDIAC: RRR, no murmurs, rubs, gallops RESPIRATORY:  Clear to auscultation without rales, wheezing or rhonchi  ABDOMEN: Soft, non-tender, non-distended MUSCULOSKELETAL:  No edema; No deformity  SKIN: Warm and dry NEUROLOGIC:  Alert and oriented x 3 PSYCHIATRIC:  Normal affect   ASSESSMENT:    1. Near syncope   2. Vasovagal episode    PLAN:    In order of problems listed above:  Vasovagal episode/near syncope/tachycardia -Constellation of symptoms, prodrome suggestive of vagal episode.  Blood pressures decrease in response to vasopressor vagal response.  Heart rate increases secondary to decrease in  blood pressure.  He documented this well on blood pressure readings. -First thing I will do will be discontinue his losartan 25.  He does state that sometimes at the doctor's office his blood pressure will be higher and it will be at home.  Could have a degree of whitecoat hypertension.  If his blood pressure were to decrease too low, this may start the vagal cycle. -I will check an echocardiogram to ensure proper structure and function of his heart. -Increase hydration, salt liberalization. -Endocrine work-up reassuring, no evidence of pheochromocytoma or hyperthyroidism. -He knows to lay down with his feet up if  he begins to feel this way.  We will see him back in follow-up.    Shared Decision Making/Informed Consent        Medication Adjustments/Labs and Tests Ordered: Current medicines are reviewed at length with the patient today.  Concerns regarding medicines are outlined above.  Orders Placed This Encounter  Procedures   ECHOCARDIOGRAM COMPLETE   No orders of the defined types were placed in this encounter.   Patient Instructions  Medication Instructions:  Please discontinue your Losartan.  Continue all other medications as listed.  *If you need a refill on your cardiac medications before your next appointment, please call your pharmacy*  Testing/Procedures: Your physician has requested that you have an echocardiogram. Echocardiography is a painless test that uses sound waves to create images of your heart. It provides your doctor with information about the size and shape of your heart and how well your hearts chambers and valves are working. This procedure takes approximately one hour. There are no restrictions for this procedure.  Follow-Up: At [facility], you and your health needs are our priority.  As part of our continuing mission to provide you with exceptional heart care, we have created designated Provider Care Teams.  These Care Teams include your primary  Cardiologist (physician) and Advanced Practice Providers (APPs -  Physician Assistants and Nurse Practitioners) who all work together to provide you with the care you need, when you need it.  We recommend signing up for the patient portal called "MyChart".  Sign up information is provided on this After Visit Summary.  MyChart is used to connect with patients for Virtual Visits (Telemedicine).  Patients are able to view lab/test results, encounter notes, upcoming appointments, etc.  Non-urgent messages can be sent to your provider as well.   To learn more about what you can do with MyChart, go to [email or web address].    Your next appointment:   3 month(s)  The format for your next appointment:   In Person  Provider:   Donato Schultz, MD   Thank you for choosing [facility]!!     Vaso-vagal near syncope.  Please try to stay well hydrated.    Signed, Donato Schultz, MD  04/10/2020 4:21 PM    [facility]

## 2020-05-07 ENCOUNTER — Ambulatory Visit (HOSPITAL_COMMUNITY): Payer: 59 | Attending: Cardiovascular Disease

## 2020-05-07 ENCOUNTER — Other Ambulatory Visit: Payer: Self-pay

## 2020-05-07 DIAGNOSIS — R55 Syncope and collapse: Secondary | ICD-10-CM | POA: Insufficient documentation

## 2020-05-07 LAB — ECHOCARDIOGRAM COMPLETE
Area-P 1/2: 3.97 cm2
P 1/2 time: 389 msec
S' Lateral: 3.2 cm

## 2020-05-15 ENCOUNTER — Telehealth: Payer: Self-pay | Admitting: *Deleted

## 2020-05-15 ENCOUNTER — Other Ambulatory Visit: Payer: Self-pay | Admitting: *Deleted

## 2020-05-15 DIAGNOSIS — I7789 Other specified disorders of arteries and arterioles: Secondary | ICD-10-CM

## 2020-05-15 NOTE — Progress Notes (Signed)
Normal pump function. Minimally dilated ascending aorta 38 mm. Essentially upper normal.  Overall reassuring echocardiogram.   Repeat echocardiogram in 1 year to ensure no change in ascending aorta.  Donato Schultz, MD    Error - echo for 04/2021 has already been ordered.

## 2020-05-15 NOTE — Telephone Encounter (Signed)
Normal pump function. Minimally dilated ascending aorta 38 mm. Essentially upper normal. Overall reassuring echocardiogram.  Repeat echocardiogram in 1 year to ensure no change in ascending aorta. Donato Schultz, MD

## 2020-07-11 ENCOUNTER — Ambulatory Visit: Payer: 59 | Admitting: Cardiology

## 2020-08-28 ENCOUNTER — Telehealth: Payer: Self-pay | Admitting: Cardiology

## 2020-08-28 NOTE — Telephone Encounter (Signed)
Spoke with patient who reports yesterday while microwaving his dinner, he felt flushed and his ears turned red.  He felt poorly.  He laid down on the floor and started drinking water.  He continually monitor his BP and HR. The lowest it got was 60/45 HR 50 bpm.  It slowly came back on it's own after rest and drinking water.  He had been being "a lazy bumb" yesterday - no working out or doing any outdoor activities.  He reports not sleeping well last night and feeling tired today but no other signs/symptoms.  Advised to continue to stay hydrated and I will forward this information to Dr Anne Fu.  Pt is aware I will call back with any new recommendations or orders.

## 2020-08-28 NOTE — Telephone Encounter (Signed)
Pt c/o BP issue: STAT if pt c/o blurred vision, one-sided weakness or slurred speech  1. What are your last 5 BP readings? Last night: BP 60/45 HR 50  2. Are you having any other symptoms (ex. Dizziness, headache, blurred vision, passed out)? Dizzy, lightheaded, feels faint, ears and neck turn red.   3. What is your BP issue? Pt's BP is low, he sent message to scheduling trying to get in to see Dr. Judd Gaudier or APP soon.

## 2020-08-29 NOTE — Telephone Encounter (Signed)
Pt is aware to continue with fluids and salt.  He will continue to monitor HR/BP.  If episode reoccurs he will contact us.  Advised of possibility of using Fludrocortisone.  Pt states he will research this to determine if he feels it would be beneficial.   He is aware he would not be started on this unless increased/more frequent episodes of hypotension.

## 2020-08-29 NOTE — Telephone Encounter (Signed)
Salt, fluids Agree If continues, may try fludrocortisone.  Thanks Donato Schultz, MD

## 2020-11-16 ENCOUNTER — Telehealth (HOSPITAL_BASED_OUTPATIENT_CLINIC_OR_DEPARTMENT_OTHER): Payer: Self-pay | Admitting: Family Medicine

## 2020-11-16 NOTE — Telephone Encounter (Signed)
Pt was wanting a Rx for his brochitis symptoms today. I refered him to an Urgent Care/Emergency Dept for his needs

## 2021-05-02 ENCOUNTER — Ambulatory Visit (HOSPITAL_COMMUNITY): Payer: 59 | Attending: Cardiology

## 2021-05-02 ENCOUNTER — Other Ambulatory Visit: Payer: Self-pay

## 2021-05-02 DIAGNOSIS — R55 Syncope and collapse: Secondary | ICD-10-CM

## 2021-05-02 DIAGNOSIS — I7789 Other specified disorders of arteries and arterioles: Secondary | ICD-10-CM | POA: Insufficient documentation

## 2021-05-02 LAB — ECHOCARDIOGRAM COMPLETE
Area-P 1/2: 2.91 cm2
S' Lateral: 3.3 cm

## 2021-11-12 ENCOUNTER — Emergency Department (HOSPITAL_BASED_OUTPATIENT_CLINIC_OR_DEPARTMENT_OTHER)
Admission: EM | Admit: 2021-11-12 | Discharge: 2021-11-12 | Disposition: A | Discharge status: Home / Self Care | Payer: 59 | Attending: Emergency Medicine | Admitting: Emergency Medicine

## 2021-11-12 ENCOUNTER — Other Ambulatory Visit: Payer: Self-pay

## 2021-11-12 ENCOUNTER — Encounter (HOSPITAL_BASED_OUTPATIENT_CLINIC_OR_DEPARTMENT_OTHER): Payer: Self-pay | Admitting: Emergency Medicine

## 2021-11-12 DIAGNOSIS — Z79899 Other long term (current) drug therapy: Secondary | ICD-10-CM | POA: Insufficient documentation

## 2021-11-12 DIAGNOSIS — I10 Essential (primary) hypertension: Secondary | ICD-10-CM | POA: Diagnosis not present

## 2021-11-12 DIAGNOSIS — R55 Syncope and collapse: Secondary | ICD-10-CM | POA: Diagnosis present

## 2021-11-12 DIAGNOSIS — T7840XA Allergy, unspecified, initial encounter: Secondary | ICD-10-CM | POA: Diagnosis not present

## 2021-11-12 LAB — CBC
HCT: 40.4 % (ref 39.0–52.0)
Hemoglobin: 13.7 g/dL (ref 13.0–17.0)
MCH: 29 pg (ref 26.0–34.0)
MCHC: 33.9 g/dL (ref 30.0–36.0)
MCV: 85.6 fL (ref 80.0–100.0)
Platelets: 243 10*3/uL (ref 150–400)
RBC: 4.72 MIL/uL (ref 4.22–5.81)
RDW: 12.3 % (ref 11.5–15.5)
WBC: 6.3 10*3/uL (ref 4.0–10.5)
nRBC: 0 % (ref 0.0–0.2)

## 2021-11-12 LAB — COMPREHENSIVE METABOLIC PANEL
ALT: 17 U/L (ref 0–44)
AST: 17 U/L (ref 15–41)
Albumin: 4.5 g/dL (ref 3.5–5.0)
Alkaline Phosphatase: 66 U/L (ref 38–126)
Anion gap: 13 (ref 5–15)
BUN: 13 mg/dL (ref 6–20)
CO2: 26 mmol/L (ref 22–32)
Calcium: 9.8 mg/dL (ref 8.9–10.3)
Chloride: 102 mmol/L (ref 98–111)
Creatinine, Ser: 1.15 mg/dL (ref 0.61–1.24)
GFR, Estimated: >60 mL/min (ref >60)
Glucose, Bld: 105 mg/dL — ABNORMAL HIGH (ref 70–99)
Potassium: 3.9 mmol/L (ref 3.5–5.1)
Sodium: 141 mmol/L (ref 135–145)
Total Bilirubin: 0.7 mg/dL (ref 0.3–1.2)
Total Protein: 7 g/dL (ref 6.5–8.1)

## 2021-11-12 LAB — URINALYSIS, ROUTINE W REFLEX MICROSCOPIC
Bilirubin Urine: NEGATIVE
Glucose, UA: NEGATIVE mg/dL
Hgb urine dipstick: NEGATIVE
Ketones, ur: NEGATIVE mg/dL
Leukocytes,Ua: NEGATIVE
Nitrite: NEGATIVE
Protein, ur: NEGATIVE mg/dL
Specific Gravity, Urine: <1.005 — ABNORMAL LOW (ref 1.005–1.030)
pH: 7.5 (ref 5.0–8.0)

## 2021-11-12 LAB — TROPONIN I (HIGH SENSITIVITY)
Troponin I (High Sensitivity): 4 ng/L
Troponin I (High Sensitivity): 5 ng/L (ref <18)

## 2021-11-12 MED ORDER — PREDNISONE 50 MG PO TABS: 50.0000 mg | ORAL_TABLET | Freq: Every day | ORAL | 0 refills | Status: AC

## 2021-11-12 MED ORDER — PREDNISONE 50 MG PO TABS
60.0000 mg | ORAL_TABLET | Freq: Once | ORAL | Status: AC
  Administered 2021-11-12: 60 mg via ORAL
  Filled 2021-11-12: qty 1

## 2021-11-12 MED ORDER — EPINEPHRINE 0.3 MG/0.3ML IJ SOAJ: 0.3000 mg | INTRAMUSCULAR | 0 refills | Status: DC | PRN

## 2021-11-12 NOTE — ED Triage Notes (Signed)
Pt arrives to ED with c/o hypertension. Pt reports that starting yesterday he experienced high blood pressure readings. He states he has been monitoring his BP over the past few days after a hypotensive episode on 6/23. Pt reports that over the past x5 days he has symptoms of left flank pain as well.

## 2021-11-14 LAB — RMSF, IGG, IFA: RMSF, IGG, IFA: <1:64 {titer}

## 2021-11-14 LAB — ROCKY MTN SPOTTED FVR ABS PNL(IGG+IGM)
RMSF IgG: POSITIVE — AB
RMSF IgM: 0.32 index (ref 0.00–0.89)

## 2021-11-15 ENCOUNTER — Other Ambulatory Visit (HOSPITAL_BASED_OUTPATIENT_CLINIC_OR_DEPARTMENT_OTHER): Payer: Self-pay | Admitting: Family Medicine

## 2021-11-15 DIAGNOSIS — R55 Syncope and collapse: Secondary | ICD-10-CM

## 2021-11-15 DIAGNOSIS — I10 Essential (primary) hypertension: Secondary | ICD-10-CM

## 2021-11-15 DIAGNOSIS — I77810 Thoracic aortic ectasia: Secondary | ICD-10-CM

## 2021-11-20 NOTE — Progress Notes (Unsigned)
Cardiology Office Note:    Date:  11/21/2021   ID:  GARRON GALEANA, DOB December 11, 1970, MRN [id number]  PCP:  Jonathon Jordan, MD   [facility] Providers Cardiologist:  Candee Furbish, MD     Referring MD: Jonathon Jordan, MD   Chief Complaint: labile blood pressure  History of Present Illness:    VARNELL MARUYAMA is a very pleasant 51 y.o. male with a hx of hypertension, presyncope, and asthma.    Referred to cardiology by PCP for evaluation of tachycardia and seen on 04/10/2020 by Dr. Marlou Porch.  He reported previous episode of drop in BP with elevated HR, 76/49, with pulse of 145.  He laid down and put feet up, symptoms resolved after 40 minutes.  Felt dizzy/hot, itching, tingling, had BM sensation.  I can had occurrence a few days later watching TV.  Felt flushing palpitations, dizziness.  Went to [facility], ER.  Troponin was normal.  Works for [employer].  He was advised to discontinue losartan.  Echocardiogram revealed normal pumping function, minimally dilated ascending aorta 38 mm.  No significant valve dysfunction.  Advised to repeat echo for follow-up ascending aorta in 1 year.  He contacted our office 08/28/2020 with low BP, dizziness, feeling lightheaded.  He was advised to increase fluid and salt intake and to continue to monitor BP.  Per Dr. Marlou Porch would start fludrocortisone if hypotension continued. Around this time losartan was discontinued by PCP.   CT cardiac score ordered by PCP is pending.  ER visit 11/12/21 for elevated blood pressure, rash.  ED provider was concerned that he had an anaphylactic reaction a few days prior and was still having allergic symptoms.  He was given prednisone 50 mg daily for 5 days. EKG and lab work was unremarkable.   Today, he is here alone and is a very concerned about his symptoms. He is tearful due to concerns. Since ER visit he has had a feeling of burning from the inside.  He was awakened suddenly at 0300 this  morning with a tightness in his chest. This is different than previous episodes of burning in extremities, ears, funny sensation in his head and BP will drop very low that has occurred on 4 occasions since 2017. Reports he never had any seizure-like activity to his knowledge. BP got as low as 68/38 on one occasion but he has never lost consciousness. His home BP readings over the past few days have been well-controlled. BP cuff calibrated at PCP office.  He has had no further episodes of hypotension since discontinuing losartan per records.  He took his last dose of prednisone 7/5 in the morning, states he split the dose into 25 mg for the last 2 doses. Continues to exercise without chest pain or dyspnea. Feels an occasional flutter in his chest. No edema, orthopnea, or PND. Continues to work for [employer].   Past Medical History:  Diagnosis Date   Allergic rhinitis, cause unspecified    Exercise induced bronchospasm    Inguinal hernia without mention of obstruction or gangrene, unilateral or unspecified, (not specified as recurrent)    Tobacco use disorder     Past Surgical History:  Procedure Laterality Date   HERNIA REPAIR      Current Medications: Current Meds  Medication Sig   albuterol (VENTOLIN HFA) 108 (90 Base) MCG/ACT inhaler 2 puffs as needed.   budesonide-formoterol (SYMBICORT) 80-4.5 MCG/ACT inhaler as needed.   EPINEPHrine 0.3 mg/0.3 mL IJ SOAJ injection Inject  0.3 mg into the muscle as needed for anaphylaxis.   fluticasone (FLONASE) 50 MCG/ACT nasal spray as needed.   metoprolol tartrate (LOPRESSOR) 100 MG tablet Take 1 tablet (100 mg total) by mouth as directed. Take 2 hours prior to CT scan.   rosuvastatin (CRESTOR) 5 MG tablet Take 5 mg by mouth daily.   [DISCONTINUED] losartan (COZAAR) 50 MG tablet Take 50 mg by mouth daily.   [DISCONTINUED] montelukast (SINGULAIR) 10 MG tablet Take 10 mg by mouth daily.   [DISCONTINUED] pantoprazole (PROTONIX) 40 MG tablet Take 1  tablet (40 mg total) by mouth daily. Take 30-60 min before first meal of the day     Allergies:   Patient has no known allergies.   Social History   Socioeconomic History   Marital status: Divorced    Spouse name: Not on file   Number of children: Not on file   Years of education: Not on file   Highest education level: Not on file  Occupational History   Not on file  Tobacco Use   Smoking status: Former    Packs/day: 0.25    Types: Cigarettes    Quit date: 09/23/2011    Years since quitting: 10.1   Smokeless tobacco: Never  Substance and Sexual Activity   Alcohol use: No   Drug use: No   Sexual activity: Not on file  Other Topics Concern   Not on file  Social History Narrative   Not on file   Social Determinants of Health   Financial Resource Strain: Not on file  Food Insecurity: Not on file  Transportation Needs: Not on file  Physical Activity: Not on file  Stress: Not on file  Social Connections: Not on file     Family History: The patient's family history is not on file.  ROS:   Please see the history of present illness.    + chest pain + palpitations All other systems reviewed and are negative.  Labs/Other Studies Reviewed:    The following studies were reviewed today:  Echo 05/02/21  1. Left ventricular ejection fraction, by estimation, is 55 to 60%. The  left ventricle has normal function. The left ventricle has no regional  wall motion abnormalities. Left ventricular diastolic parameters were  normal. The average left ventricular  global longitudinal strain is -21.4 %. The global longitudinal strain is  normal.   2. Right ventricular systolic function is normal. The right ventricular  size is normal.   3. The mitral valve is normal in structure. No evidence of mitral valve  regurgitation. No evidence of mitral stenosis.   4. The aortic valve is tricuspid. Aortic valve regurgitation is trivial.  No aortic stenosis is present.   5. Aortic  dilatation noted. There is borderline dilatation of the  ascending aorta, measuring 38 mm.   6. The inferior vena cava is normal in size with greater than 50%  respiratory variability, suggesting right atrial pressure of 3 mmHg.   Comparison(s): No significant change from prior study. 04/27/20 EF 60-65%  Ascending aorta 38 mm.   Echo 05/07/2020  1. Left ventricular ejection fraction, by estimation, is 60 to 65%. The  left ventricle has normal function. The left ventricle has no regional  wall motion abnormalities. There is mild asymmetric left ventricular  hypertrophy of the basal-septal segment.  Left ventricular diastolic parameters are consistent with Grade I  diastolic dysfunction (impaired relaxation).   2. Right ventricular systolic function is normal. The right ventricular  size is normal.  There is normal pulmonary artery systolic pressure.   3. The mitral valve is normal in structure. Trivial mitral valve  regurgitation. No evidence of mitral stenosis.   4. The aortic valve is tricuspid. Aortic valve regurgitation is trivial.  No aortic stenosis is present.   5. Aortic dilatation noted. There is mild dilatation of the ascending  aorta, measuring 38 mm.   6. The inferior vena cava is normal in size with greater than 50%  respiratory variability, suggesting right atrial pressure of 3 mmHg.   Recent Labs: 11/12/2021: ALT 17; BUN 13; Creatinine, Ser 1.15; Hemoglobin 13.7; Platelets 243; Potassium 3.9; Sodium 141  Recent Lipid Panel No results found for: "CHOL", "TRIG", "HDL", "CHOLHDL", "VLDL", "LDLCALC", "LDLDIRECT"   Risk Assessment/Calculations:       Physical Exam:    VS:  BP 104/80   Pulse 87   Ht 5\' 8"  (1.727 m)   Wt 157 lb (71.2 kg)   SpO2 97%   BMI 23.87 kg/m     Wt Readings from Last 3 Encounters:  11/21/21 157 lb (71.2 kg)  11/12/21 162 lb (73.5 kg)  04/10/20 162 lb (73.5 kg)     GEN:  Well nourished, well developed in no acute distress HEENT:  Normal NECK: No JVD; No carotid bruits CARDIAC: RRR, no murmurs, rubs, gallops RESPIRATORY:  Clear to auscultation without rales, wheezing or rhonchi  ABDOMEN: Soft, non-tender, non-distended MUSCULOSKELETAL:  No edema; No deformity. 2+ pedal pulses, equal bilaterally SKIN: Warm and dry NEUROLOGIC:  Alert and oriented x 3 PSYCHIATRIC:  Normal affect   EKG:  EKG is not ordered today.    Diagnoses:    1. Palpitations   2. Precordial pain   3. Essential hypertension   4. Near syncope   5. Hyperlipidemia LDL goal <100    Assessment and Plan:     Palpitations: Having palpitations that occur randomly, not exertional. Additionally has had episodes of presyncope and chest tightness. We will place a 14-day ZIO monitor for evaluation of arrhythmia.  Chest pain: Recent episodes of chest tightness, not exertional. We discussed coronary calcium score which is pending. I think with the level of concern he has, a coronary CTA will be a more thorough evaluation of chest discomfort.  He will take metoprolol tartrate 100 mg 2 hours prior to test.  Presyncope: Past history of prodrome followed by burning in extremities and ears. These were accompanied by hypotension which would gradually resolve with lying on floor with feet elevated. Not 2/2 exertion, stress, or other common factor. Has continued to have episodes of hypotension, not as frequent since stopping losartan.   He hydrates well. As noted above, will get cardiac monitor to evaluate for arrhythmia.  Labile BP: Blood pressure well controlled, somewhat soft today.  Home BP readings consistently less than 140/80.  Occasional low readings similar to today.  Recently discontinued losartan due to hypotension. Will keep him off losartan for now. Advised him to monitor less frequently, one time per day or if symptomatic.   Hyperlipidemia: LDL 78 on 10/21/21. Continue rosuvastatin. We will get coronary calcium score and if it is elevated, will increase  intensity of LDL reduction.    Disposition: 2-3 months with APP   Medication Adjustments/Labs and Tests Ordered: Current medicines are reviewed at length with the patient today.  Concerns regarding medicines are outlined above.  Orders Placed This Encounter  Procedures   CT CORONARY MORPH W/CTA COR W/SCORE W/CA W/CM &/OR WO/CM   Basic Metabolic Panel (BMET)  LONG TERM MONITOR (3-14 DAYS)   Meds ordered this encounter  Medications   metoprolol tartrate (LOPRESSOR) 100 MG tablet    Sig: Take 1 tablet (100 mg total) by mouth as directed. Take 2 hours prior to CT scan.    Dispense:  1 tablet    Refill:  0    Patient Instructions  Medication Instructions:   Your physician recommends that you continue on your current medications as directed. Please refer to the Current Medication list given to you today.   *If you need a refill on your cardiac medications before your next appointment, please call your pharmacy*   Lab Work:  TODAY!!!!BMET  If you have labs (blood work) drawn today and your tests are completely normal, you will receive your results only by: MyChart Message (if you have MyChart) OR A paper copy in the mail If you have any lab test that is abnormal or we need to change your treatment, we will call you to review the results.   Testing/Procedures:    Your cardiac CT will be scheduled at one of the below locations:   [facility] [address] [place], [place] [address] [phone]  OR  [facility] [address] [place], [place] [address] [phone]  If scheduled at [facility], please arrive at the [facility] and Children's Entrance (Entrance C2) of [facility] 30 minutes prior to test start time. You can use the FREE valet parking offered at entrance C (encouraged to control the heart rate for the test)  Proceed to the [facility] Radiology Department (first floor) to  check-in and test prep.  All radiology patients and guests should use entrance C2 at [facility], accessed from [facility], even though the hospital's physical address listed is [address].     Please follow these instructions carefully (unless otherwise directed):  Hold all erectile dysfunction medications at least 3 days (72 hrs) prior to test.  On the Night Before the Test: Be sure to Drink plenty of water. Do not consume any caffeinated/decaffeinated beverages or chocolate 12 hours prior to your test. Do not take any antihistamines 12 hours prior to your test.   On the Day of the Test: Drink plenty of water until 1 hour prior to the test. Do not eat any food 4 hours prior to the test. You may take your regular medications prior to the test.  Take metoprolol (Lopressor) two hours prior to test.   After the Test: Drink plenty of water. After receiving IV contrast, you may experience a mild flushed feeling. This is normal. On occasion, you may experience a mild rash up to 24 hours after the test. This is not dangerous. If this occurs, you can take Benadryl 25 mg and increase your fluid intake. If you experience trouble breathing, this can be serious. If it is severe call 911 IMMEDIATELY. If it is mild, please call our office. If you take any of these medications: Glipizide/Metformin, Avandament, Glucavance, please do not take 48 hours after completing test unless otherwise instructed.  We will call to schedule your test 2-4 weeks out understanding that some insurance companies will need an authorization prior to the service being performed.   For non-scheduling related questions, please contact the cardiac imaging nurse navigator should you have any questions/concerns: Rockwell Alexandria, Cardiac Imaging Nurse Navigator Larey Brick, Cardiac Imaging Nurse Navigator [facility] Direct Office Dial: [phone]  ZIO XT-  Long Term Monitor Instructions  Your physician has requested you wear a ZIO patch monitor for 14 days.  This is a single patch monitor. [employer] supplies one patch monitor per enrollment. Additional stickers are not available. Please do not apply patch if you will be having a Nuclear Stress Test,  Echocardiogram, Cardiac CT, MRI, or Chest Xray during the period you would be wearing the  monitor. The patch cannot be worn during these tests. You cannot remove and re-apply the  ZIO XT patch monitor.  Your ZIO patch monitor will be mailed 3 day USPS to your address on file. It may take 3-5 days  to receive your monitor after you have been enrolled.  Once you have received your monitor, please review the enclosed instructions. Your monitor  has already been registered assigning a specific monitor serial # to you.  Billing and Patient Assistance Program Information  We have supplied [employer] with any of your insurance information on file for billing purposes. [employer] offers a sliding scale Patient Assistance Program for patients that do not have  insurance, or whose insurance does not completely cover the cost of the ZIO monitor.  You must apply for the Patient Assistance Program to qualify for this discounted rate.  To apply, please call [employer] at [phone], select option 4, select option 2, ask to apply for  Patient Assistance Program. Meredeth Ide will ask your household income, and how many people  are in your household. They will quote your out-of-pocket cost based on that information.  [employer] will also be able to set up a 27-month, interest-free payment plan if needed.  Applying the monitor   Shave hair from upper left chest.  Hold abrader disc by orange tab. Rub abrader in 40 strokes over the upper left chest as  indicated in your monitor instructions.  Clean area with 4 enclosed alcohol pads. Let dry.  Apply patch as indicated in monitor instructions. Patch will be placed under  collarbone on left  side of chest with arrow pointing upward.  Rub patch adhesive wings for 2 minutes. Remove white label marked "1". Remove the white  label marked "2". Rub patch adhesive wings for 2 additional minutes.  While looking in a mirror, press and release button in center of patch. A small green light will  flash 3-4 times. This will be your only indicator that the monitor has been turned on.  Do not shower for the first 24 hours. You may shower after the first 24 hours.  Press the button if you feel a symptom. You will hear a small click. Record Date, Time and  Symptom in the Patient Logbook.  When you are ready to remove the patch, follow instructions on the last 2 pages of Patient  Logbook. Stick patch monitor onto the last page of Patient Logbook.  Place Patient Logbook in the blue and white box. Use locking tab on box and tape box closed  securely. The blue and white box has prepaid postage on it. Please place it in the mailbox as  soon as possible. Your physician should have your test results approximately 7 days after the  monitor has been mailed back to [facility].  Call [facility] Customer Care at [phone] if you have questions regarding  your ZIO XT patch monitor. Call them immediately if you see an orange light blinking on your  monitor.  If your monitor falls off in less than 4 days, contact our Monitor department at [phone].  If  your monitor becomes loose or falls off after 4 days call [employer] at [phone] for  suggestions on securing your monitor    Follow-Up: At [facility], you and your health needs are our priority.  As part of our continuing mission to provide you with exceptional heart care, we have created designated Provider Care Teams.  These Care Teams include your primary Cardiologist (physician) and Advanced Practice Providers (APPs -  Physician Assistants and Nurse Practitioners) who all work together to provide you with the care  you need, when you need it.  We recommend signing up for the patient portal called "MyChart".  Sign up information is provided on this After Visit Summary.  MyChart is used to connect with patients for Virtual Visits (Telemedicine).  Patients are able to view lab/test results, encounter notes, upcoming appointments, etc.  Non-urgent messages can be sent to your provider as well.   To learn more about what you can do with MyChart, go to [email or web address].    Your next appointment:   2 month(s)  The format for your next appointment:   In Person  Provider:   Christen Bame, NP         Important Information About Sugar         Signed, Emmaline Life, NP  11/21/2021 4:24 PM    [place]

## 2021-11-21 ENCOUNTER — Encounter: Payer: Self-pay | Admitting: Nurse Practitioner

## 2021-11-21 ENCOUNTER — Ambulatory Visit (INDEPENDENT_AMBULATORY_CARE_PROVIDER_SITE_OTHER): Payer: 59

## 2021-11-21 ENCOUNTER — Ambulatory Visit: Payer: 59 | Admitting: Nurse Practitioner

## 2021-11-21 VITALS — BP 104/80 | HR 87 | Ht 68.0 in | Wt 157.0 lb

## 2021-11-21 DIAGNOSIS — R072 Precordial pain: Secondary | ICD-10-CM

## 2021-11-21 DIAGNOSIS — I10 Essential (primary) hypertension: Secondary | ICD-10-CM

## 2021-11-21 DIAGNOSIS — R002 Palpitations: Secondary | ICD-10-CM

## 2021-11-21 DIAGNOSIS — R55 Syncope and collapse: Secondary | ICD-10-CM

## 2021-11-21 DIAGNOSIS — E785 Hyperlipidemia, unspecified: Secondary | ICD-10-CM

## 2021-11-21 MED ORDER — METOPROLOL TARTRATE 100 MG PO TABS
100.0000 mg | ORAL_TABLET | ORAL | 0 refills | Status: DC
Start: 2021-11-21 — End: 2021-12-24

## 2021-11-21 NOTE — Progress Notes (Unsigned)
[id number] ZIO XT from office inventory applied to patient.    Dr. Anne Fu to read.

## 2021-11-21 NOTE — Patient Instructions (Signed)
Medication Instructions:   Your physician recommends that you continue on your current medications as directed. Please refer to the Current Medication list given to you today.   *If you need a refill on your cardiac medications before your next appointment, please call your pharmacy*   Lab Work:  TODAY!!!!BMET  If you have labs (blood work) drawn today and your tests are completely normal, you will receive your results only by: [place] (if you have MyChart) OR A paper copy in the mail If you have any lab test that is abnormal or we need to change your treatment, we will call you to review the results.   Testing/Procedures:    Your cardiac CT will be scheduled at one of the below locations:   [facility] [address] [place], [place] [address] [phone]  [place] [address] [place], [place] [address] [phone]  If scheduled at [facility], please arrive at the [facility] (Entrance C2) of [facility] 30 minutes prior to test start time. You can use the FREE valet parking offered at entrance C (encouraged to control the heart rate for the test)  Proceed to the [facility] Radiology Department (first floor) to check-in and test prep.  All radiology patients and guests should use entrance C2 at [facility], accessed from [facility], even though the hospital's physical address listed is [address]     Please follow these instructions carefully (unless otherwise directed):  Hold all erectile dysfunction medications at least 3 days (72 hrs) prior to test.  On the Night Before the Test: Be sure to Drink plenty of water. Do not consume any caffeinated/decaffeinated beverages or chocolate 12 hours prior to your test. Do not take any antihistamines 12 hours prior to your test.   On the Day of the Test: Drink plenty of  water until 1 hour prior to the test. Do not eat any food 4 hours prior to the test. You may take your regular medications prior to the test.  Take metoprolol (Lopressor) two hours prior to test.   After the Test: Drink plenty of water. After receiving IV contrast, you may experience a mild flushed feeling. This is normal. On occasion, you may experience a mild rash up to 24 hours after the test. This is not dangerous. If this occurs, you can take Benadryl 25 mg and increase your fluid intake. If you experience trouble breathing, this can be serious. If it is severe call 911 IMMEDIATELY. If it is mild, please call our office. If you take any of these medications: Glipizide/Metformin, Avandament, Glucavance, please do not take 48 hours after completing test unless otherwise instructed.  We will call to schedule your test 2-4 weeks out understanding that some insurance companies will need an authorization prior to the service being performed.   For non-scheduling related questions, please contact the cardiac imaging nurse navigator should you have any questions/concerns: Marchia Bond, Cardiac Imaging Nurse Navigator Gordy Clement, Cardiac Imaging Nurse Navigator [facility] Direct Office Dial: [phone]   Bryn Gulling- Long Term Monitor Instructions  Your physician has requested you wear a ZIO patch monitor for 14 days.  This is a single patch monitor. [employer] supplies one patch monitor per enrollment. Additional stickers are not available. Please do not apply patch if you will be having a Nuclear Stress Test,  Echocardiogram, Cardiac CT, MRI, or  Chest Xray during the period you would be wearing the  monitor. The patch cannot be worn during these tests. You cannot remove and re-apply the  ZIO XT patch monitor.  Your ZIO patch monitor will be mailed 3 day USPS to your address on file. It may take 3-5 days  to receive your monitor after you have been enrolled.   Once you have received your monitor, please review the enclosed instructions. Your monitor  has already been registered assigning a specific monitor serial # to you.  Billing and Patient Assistance Program Information  We have supplied [employer] with any of your insurance information on file for billing purposes. [employer] offers a sliding scale Patient Assistance Program for patients that do not have  insurance, or whose insurance does not completely cover the cost of the ZIO monitor.  You must apply for the Patient Assistance Program to qualify for this discounted rate.  To apply, please call [employer] at [phone], select option 4, select option 2, ask to apply for  Patient Assistance Program. Meredeth Ide will ask your household income, and how many people  are in your household. They will quote your out-of-pocket cost based on that information.  [employer] will also be able to set up a 33-month, interest-free payment plan if needed.  Applying the monitor   Shave hair from upper left chest.  Hold abrader disc by orange tab. Rub abrader in 40 strokes over the upper left chest as  indicated in your monitor instructions.  Clean area with 4 enclosed alcohol pads. Let dry.  Apply patch as indicated in monitor instructions. Patch will be placed under collarbone on left  side of chest with arrow pointing upward.  Rub patch adhesive wings for 2 minutes. Remove white label marked "1". Remove the white  label marked "2". Rub patch adhesive wings for 2 additional minutes.  While looking in a mirror, press and release button in center of patch. A small green light will  flash 3-4 times. This will be your only indicator that the monitor has been turned on.  Do not shower for the first 24 hours. You may shower after the first 24 hours.  Press the button if you feel a symptom. You will hear a small click. Record Date, Time and  Symptom in the Patient Logbook.  When you are ready to remove the patch, follow  instructions on the last 2 pages of Patient  Logbook. Stick patch monitor onto the last page of Patient Logbook.  Place Patient Logbook in the blue and white box. Use locking tab on box and tape box closed  securely. The blue and white box has prepaid postage on it. Please place it in the mailbox as  soon as possible. Your physician should have your test results approximately 7 days after the  monitor has been mailed back to [facility].  Call [facility] Customer Care at [phone] if you have questions regarding  your ZIO XT patch monitor. Call them immediately if you see an orange light blinking on your  monitor.  If your monitor falls off in less than 4 days, contact our Monitor department at [phone].  If your monitor becomes loose or falls off after 4 days call [employer] at [phone] for  suggestions on securing your monitor    Follow-Up: At [facility], you and your health needs are our priority.  As part of our continuing mission to provide you with exceptional heart care, we have created designated Provider Care Teams.  These  Care Teams include your primary Cardiologist (physician) and Advanced Practice Providers (APPs -  Physician Assistants and Nurse Practitioners) who all work together to provide you with the care you need, when you need it.  We recommend signing up for the patient portal called "MyChart".  Sign up information is provided on this After Visit Summary.  MyChart is used to connect with patients for Virtual Visits (Telemedicine).  Patients are able to view lab/test results, encounter notes, upcoming appointments, etc.  Non-urgent messages can be sent to your provider as well.   To learn more about what you can do with MyChart, go to [email or web address].    Your next appointment:   2 month(s)  The format for your next appointment:   In Person  Provider:   Eligha Bridegroom, NP         Important Information About Sugar

## 2021-11-22 LAB — BASIC METABOLIC PANEL
BUN/Creatinine Ratio: 11 (ref 9–20)
BUN: 12 mg/dL (ref 6–24)
CO2: 24 mmol/L (ref 20–29)
Calcium: 9.5 mg/dL (ref 8.7–10.2)
Chloride: 103 mmol/L (ref 96–106)
Creatinine, Ser: 1.12 mg/dL (ref 0.76–1.27)
Glucose: 95 mg/dL (ref 70–99)
Potassium: 4.3 mmol/L (ref 3.5–5.2)
Sodium: 144 mmol/L (ref 134–144)
eGFR: 80 mL/min/{1.73_m2} (ref >59)

## 2021-11-27 ENCOUNTER — Other Ambulatory Visit (HOSPITAL_BASED_OUTPATIENT_CLINIC_OR_DEPARTMENT_OTHER): Payer: 59

## 2021-12-02 ENCOUNTER — Telehealth: Payer: Self-pay | Admitting: Cardiology

## 2021-12-02 NOTE — Telephone Encounter (Signed)
I spoke with the patient re: his myChart message.  He is still wearing the monitor.  He has sent PCP a message requesting to be tested for alpha gal.  He does not have an appointment with allergy until 01/21/22.  He will call to get on their wait list.    His HR does not feel irregular when fast.  Using pulse ox to measure.  He ate meat yesterday several hours before feeling poorly.  Adv to stay well hydrated and since his HR elevated after benadryl I adv to use Zyrtec as his antihistamine and adv for now it is safe to use daily as needed for symptoms.      Aware I will forward to Dr. Anne Fu to make him aware.  Will call if any other recommendations.

## 2021-12-02 NOTE — Telephone Encounter (Signed)
  Per MyChart scheduling message:  Hypertension and hypotension this past week.  Heart flutters and irregular heart beat. This evening I had another of the episodes however slightly different this time.  Was sitting on my sofa watching tv when occurred. Same red ears and splotchy neck and chest so took benedryl however this time my BP stayed relatively normal and my HR elevated to 155.  After 10-15 minutes HR came down.  Veins in my forehead bulging.  Last bp reading 133/86 HR 90.

## 2021-12-04 ENCOUNTER — Other Ambulatory Visit (HOSPITAL_BASED_OUTPATIENT_CLINIC_OR_DEPARTMENT_OTHER): Payer: 59

## 2021-12-06 ENCOUNTER — Telehealth (HOSPITAL_COMMUNITY): Payer: Self-pay | Admitting: Emergency Medicine

## 2021-12-06 NOTE — Telephone Encounter (Signed)
Reaching out to patient to offer assistance regarding upcoming cardiac imaging study; pt verbalizes understanding of appt date/time, parking situation and where to check in, pre-test NPO status and medications ordered, and verified current allergies; name and call back number provided for further questions should they arise Rockwell Alexandria RN Navigator Cardiac Imaging [facility] [phone] office [phone] cell  Denies IV issues Pt reports hes been taking BPs at home and avg 130/80, 100mg  metoprolol tartrate 2 hr prior to scan Aware of nitro Arrival 1030

## 2021-12-10 ENCOUNTER — Ambulatory Visit (HOSPITAL_COMMUNITY)
Admission: RE | Admit: 2021-12-10 | Discharge: 2021-12-10 | Disposition: A | Discharge status: Home / Self Care | Payer: 59 | Source: Ambulatory Visit | Attending: Nurse Practitioner | Admitting: Nurse Practitioner

## 2021-12-10 DIAGNOSIS — R072 Precordial pain: Secondary | ICD-10-CM | POA: Diagnosis present

## 2021-12-10 MED ORDER — NITROGLYCERIN 0.4 MG SL SUBL
SUBLINGUAL_TABLET | SUBLINGUAL | Status: AC
  Filled 2021-12-10: qty 2

## 2021-12-10 MED ORDER — NITROGLYCERIN 0.4 MG SL SUBL
0.8000 mg | SUBLINGUAL_TABLET | Freq: Once | SUBLINGUAL | Status: AC
  Administered 2021-12-10: 0.8 mg via SUBLINGUAL

## 2021-12-10 MED ORDER — IOHEXOL 350 MG/ML SOLN
100.0000 mL | Freq: Once | INTRAVENOUS | Status: AC | PRN
  Administered 2021-12-10: 100 mL via INTRAVENOUS

## 2021-12-18 ENCOUNTER — Telehealth: Payer: Self-pay | Admitting: Cardiology

## 2021-12-18 NOTE — Telephone Encounter (Signed)
Pt sent this via Mychart to the scheduling pool just as a FYI for [employer]:    Dr. Lissa Hoard, Just wanted to inform you that I have tested positive for Alpha Gal Syndrome.  I would greatly appreciate it if you could research the impacts that it may have on my heart before my next Appt.   Thank you, Tyler Hawkins

## 2021-12-19 NOTE — Telephone Encounter (Signed)
Called pt advised of providers recommendation:  "I am not aware of any specific cardiac concerns with Alpha Gal. The coronary CT and the echocardiogram have both shown [place] good results. I will review the monitor when the results are available but so far there has not been anything concerning." Pt had no further questions or concerns.

## 2021-12-19 NOTE — Telephone Encounter (Signed)
I am not aware of any specific cardiac concerns with Alpha Gal. The coronary CT and the echocardiogram have both shown [place] good results. I will review the monitor when the results are available but so far there has not been anything concerning.

## 2021-12-24 ENCOUNTER — Encounter: Payer: Self-pay | Admitting: Allergy & Immunology

## 2021-12-24 ENCOUNTER — Ambulatory Visit: Payer: 59 | Admitting: Allergy & Immunology

## 2021-12-24 VITALS — BP 130/80 | HR 76 | Temp 98.3°F | Resp 12 | Ht 66.54 in | Wt 145.4 lb

## 2021-12-24 DIAGNOSIS — E7800 Pure hypercholesterolemia, unspecified: Secondary | ICD-10-CM | POA: Insufficient documentation

## 2021-12-24 DIAGNOSIS — M4300 Spondylolysis, site unspecified: Secondary | ICD-10-CM | POA: Insufficient documentation

## 2021-12-24 DIAGNOSIS — M542 Cervicalgia: Secondary | ICD-10-CM | POA: Insufficient documentation

## 2021-12-24 DIAGNOSIS — F411 Generalized anxiety disorder: Secondary | ICD-10-CM | POA: Insufficient documentation

## 2021-12-24 DIAGNOSIS — Z91018 Allergy to other foods: Secondary | ICD-10-CM | POA: Insufficient documentation

## 2021-12-24 DIAGNOSIS — M4316 Spondylolisthesis, lumbar region: Secondary | ICD-10-CM | POA: Insufficient documentation

## 2021-12-24 DIAGNOSIS — I77810 Thoracic aortic ectasia: Secondary | ICD-10-CM | POA: Insufficient documentation

## 2021-12-24 DIAGNOSIS — T7840XD Allergy, unspecified, subsequent encounter: Secondary | ICD-10-CM | POA: Diagnosis not present

## 2021-12-24 DIAGNOSIS — I479 Paroxysmal tachycardia, unspecified: Secondary | ICD-10-CM | POA: Insufficient documentation

## 2021-12-24 DIAGNOSIS — E559 Vitamin D deficiency, unspecified: Secondary | ICD-10-CM | POA: Insufficient documentation

## 2021-12-24 DIAGNOSIS — J4520 Mild intermittent asthma, uncomplicated: Secondary | ICD-10-CM

## 2021-12-24 DIAGNOSIS — I10 Essential (primary) hypertension: Secondary | ICD-10-CM | POA: Insufficient documentation

## 2021-12-24 DIAGNOSIS — R55 Syncope and collapse: Secondary | ICD-10-CM | POA: Insufficient documentation

## 2021-12-24 DIAGNOSIS — I959 Hypotension, unspecified: Secondary | ICD-10-CM

## 2021-12-24 DIAGNOSIS — M5030 Other cervical disc degeneration, unspecified cervical region: Secondary | ICD-10-CM | POA: Insufficient documentation

## 2021-12-24 DIAGNOSIS — J45909 Unspecified asthma, uncomplicated: Secondary | ICD-10-CM | POA: Insufficient documentation

## 2021-12-24 MED ORDER — BUDESONIDE-FORMOTEROL FUMARATE 80-4.5 MCG/ACT IN AERO: 2.0000 | INHALATION_SPRAY | RESPIRATORY_TRACT | 5 refills | Status: DC | PRN

## 2021-12-24 MED ORDER — EPINEPHRINE 0.3 MG/0.3ML IJ SOAJ
0.3000 mg | INTRAMUSCULAR | 1 refills | Status: DC | PRN
Start: 2021-12-24 — End: 2021-12-25

## 2021-12-24 MED ORDER — ALBUTEROL SULFATE HFA 108 (90 BASE) MCG/ACT IN AERS: 2.0000 | INHALATION_SPRAY | RESPIRATORY_TRACT | 1 refills | Status: DC | PRN

## 2021-12-24 NOTE — Patient Instructions (Addendum)
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  - We will get some labs to rule out serious causes of hives/allergic reactions: complete blood count with differential, tryptase level, chronic urticaria panel, ESR, and CRP. - We are getting some IgE (allergy levels) to gums.  - I am also getting a c-kit genetic test to looks for mast cell genetic changes that can be associated with mast cell activation syndrome.  - I was able to see what [facility] order, so I am not replicating those. - In the meantime, continue with suppressive dosing of antihistamines:   - Morning: Allegra (fexofenadine) 155m tablet + Pepcid (famotidine) 225m - Evening: Allegra (fexofenadine) 18080mablet + Pepcid (famotidine) 82m46mYou can change this dosing at home, decreasing the dose as needed or increasing the dosing as needed.  - We could add something called cromolyn to your regimen, but this is four times daily and often very expensive. - Singulair (montelukast) can help mast cell activation syndrome, but you have already tried and failed that medication.  - Anaphylaxis management plan provided - DO NOT hesitate to use the EpiPen during the hypotension episodes (antihistamines take 20-30 minutes to work, epinephrine works in 3-5 minutes).  2. Intermittent asthma - Lung testing looked normal.  - Daily controller medication(s): NOTHING - Rescue medications OR changes during respiratory infections/worsening symptoms: albuterol 4 puffs every 4-6 hours OR Symbicort 80mg54m puffs every 4-6 hours as needed (MAX 16 puffs in one day) - Asthma control goals:  * Full participation in all desired activities (may need albuterol before activity) * Albuterol use two time or less a week on average (not counting use  with activity) * Cough interfering with sleep two time or less a month * Oral steroids no more than once a year * No hospitalizations  3. Return in about 6 weeks (around 02/04/2022).    Please inform us ofK[place]any Emergency Department visits, hospitalizations, or changes in symptoms. Call us beKoreare going to the ED for breathing or allergy symptoms since we might be able to fit you in for a sick visit. Feel free to contact us anKoreaime with any questions, problems, or concerns.  It was a pleasure to meet you today! We will put some pieces together!   Websites that have reliable patient information: 1. American Academy of Asthma, Allergy, and Immunology: www.aaaai.org 2. Food Allergy Research and Education (FARE): foodallergy.org 3. Mothers of Asthmatics: http://www.asthmacommunitynetwork.org 4. American College of Allergy, Asthma, and Immunology: www.acaai.org   COVID-19 Vaccine Information can be found at: [email or web address] related to vaccine distribution or appointments, please email [email or web address] or call [phone] realize that you might be concerned about having an allergic reaction to the COVID19 vaccines. To help with that concern, WE ARE OFFERING THE COVID19 VACCINES IN OUR OFFICE! Ask the front desk for dates!     "Like" us onKoreaacebook and Instagram for our latest updates!      A healthy democracy works best when ALL vNew York Life Insuranceicipate! Make sure you are registered to vote! If you have moved or changed any of your contact information, you will need to get this updated before voting!  In some cases, you MAY be able to register to vote online: [email or web address]

## 2021-12-24 NOTE — Progress Notes (Signed)
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  He had Hawkins very thorough work-up to look for other etiologies including pheochromocytoma and other hormone issues that could lead to similar symptoms.  He does have Hawkins known alpha gal syndrome and testing today does reaffirm that.  Many of his episodes have been preempted by eating red meat.  He has never actively avoided at, although he only had the alpha-gal panel that was positive in July 2023 after years of having the symptoms.  Going through the symptoms mast cell activation syndrome, he certainly has the skin manifestations including flushing and pruritus.  He does have the GI symptoms as well including cramping and nausea.  He also certainly has the hypotension and tachycardia as well as the fatigue and lethargy.  He does have what sounds like reactive airway dysfunction syndrome with sensitivity to sense which causes coughing and wheezing, although I am not clear that these are necessarily correlated with the episodes of hypotension and other symptoms.    We are going to get Hawkins C-kit mutation analysis today as well as Hawkins repeat tryptase and alpha gal.  We are also getting allergy levels to various gums which can be hidden in Hawkins lot of foods.  We will go ahead and get Hawkins prostaglandin D2 level in the urine.  Since we are getting blood, we will get an environmental allergy panel.  We will also get Hawkins number of other test to look for weird causes of hives, although his symptoms certainly go beyond  just run-of-the-mill hives.  We are going to put him on suppressive doses of antihistamines including Allegra and Pepcid twice daily.  I think Xolair might be Hawkins really good option for him as an immunomodulatory agent.  Plan/Recommendations:   Allergic reaction - with confirmed alpha gal and concern for mast cell activation syndrome - Your history does not have any "red flags" such as fevers, joint pains, or permanent skin changes that would be concerning for Hawkins more serious cause of hives/allergic reactions.  - We will get some labs to rule out serious causes of hives/allergic reactions: complete blood count with differential, tryptase level, chronic urticaria panel, ESR, and CRP. - We are getting some IgE (allergy levels) to gums.  - I am also getting Hawkins c-kit genetic test to looks for mast cell genetic changes that can be associated with mast cell activation syndrome.  - I was able to see what [facility] order, so I am not replicating those. - In the meantime, continue with suppressive dosing of antihistamines:   - Morning: Allegra (fexofenadine) 153m tablet + Pepcid (famotidine) 280m - Evening: Allegra (fexofenadine) 18060mablet + Pepcid (famotidine) 19m33mYou can change this dosing at home, decreasing the dose as needed or increasing the dosing as needed.  - We could add something called cromolyn to your regimen, but this is four times daily and often very expensive. - Singulair (montelukast) can help mast cell activation syndrome, but you have already tried and failed that medication.  - Anaphylaxis management plan provided -  DO NOT hesitate to use the EpiPen during the hypotension episodes (antihistamines take 20-30 minutes to work, epinephrine works in 3-5 minutes).  2. Intermittent asthma - Lung testing looked normal.  - Daily controller medication(s): NOTHING - Rescue medications OR changes during respiratory infections/worsening symptoms: albuterol 4 puffs every 4-6 hours OR Symbicort  93m two puffs every 4-6 hours as needed (MAX 16 puffs in one day) - Asthma control goals:  * Full participation in all desired activities (may need albuterol before activity) * Albuterol use two time or less Hawkins week on average (not counting use with activity) * Cough interfering with sleep two time or less Hawkins month * Oral steroids no more than once Hawkins year * No hospitalizations  3. Return in about 6 weeks (around 02/04/2022).   This note in its entirety was forwarded to the Provider who requested this consultation.  Subjective:   Tyler VICENCIOis Hawkins 51y.o. male presenting today for evaluation of  Chief Complaint  Patient presents with   Other    Alpha gal took allegra yesterday.   Allergy Testing    Tyler Hawkins history of the following: Patient Active Problem List   Diagnosis Date Noted   Anxiety state 12/24/2021   Asthmatic bronchitis 12/24/2021   DDD (degenerative disc disease), cervical 12/24/2021   Food allergy 12/24/2021   Hypertension 12/24/2021   Neck pain 12/24/2021   [place] tachy[place] 12/24/[place]   Pure hypercholesterolemia 12/24/2021   Spondylolisthesis, lumbar region 12/24/2021   Spondylolysis 12/24/2021   Thoracic aortic ectasia ([facility] 12/24/2021   Vasovagal syncope 12/24/2021   Vitamin D deficiency 12/24/2021   Upper airway cough syndrome 02/28/2016   Bilateral inguinal hernia 08/21/2011    History obtained from: chart review and patient.  Tyler Ariswas referred by Tyler Jordan MD.     [name] Hawkins 51y.o. 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  In 2017, he had his first episode. He was going to go to bed and his ears started burning and his hands and arms started itching. He elevated his feet and this subsided. [facility]. All of the workup was normal.   One year goes by and the same thing happens again in 2020. He has Hawkins BP  monitor now and he had Hawkins blood presure dorp to the 70s/30s during the episodes. These episodes last for 20-30 minutes. He did not go to the ED and he recovered on his own for Hawkins number of them.   Then in 2019, he was sitting on his sofa and felt his smyptoms start to happen again. He raised his feet and sat on the sofa. He never lost consciousness. EKGs and whatnot are all normal.   With all of this happening, he ended up going to get an echocardiogram. He was noted to have an enlarged aorta. Repeat one was stable. All of the EF and valve functions were all normal. His Cardiologist is Dr. MGordy Levan His latest echocardiograph was December 2022 and was noted to be stable from the previous one in December 2021:     Within the last 8 weeks, he was going home on Hawkins Friday and he felt it starting to happen. He found one of his partners at work and he laid down. He put his BP monitor on and it dropped to 68/34. EMS called and EKG was normal.  Things were normal after 30 moniutes. Over the weekend, he was fine. Monday he went back to work and his BPs were all over the place. Everything was normal at the ED visit at [facility]. BP was actually elevated when he went in there.   Review of the emergency room visit from June 2023 showed that he had Hawkins troponin that was normal.  He also had [facility] spotted fever titers that showed Hawkins positive IgG with an IgM of 0.32 which is equivocal.  He was given prednisone in the ER.  His blood pressure was 144/92 when he was in the ER.  He did have Hawkins diffuse rash over his neck and much of his other body. An EKG showed no evidence of Hawkins STEMI. It seems that he was started on doxycycline recently to treat this.   He got some prednisone in the ED. This was the only time that he got prednisone udring this time. He has had it in the past for bronchitis. He typically gets bronchitis every other year or so. He typically azithromycin, prednisone taper, and codeine cough medicine. This  is every year at the most.   He felt normal again around 6 weeks ago. He was feeling normal until July 16th. He was feeling good and then on July 16th, he was spending time with his daughter. They went to get Salsaritas and they were hanging out. They ate that around 2pm. He felt this coming on around 5:30. He took some Benadryl.   He had Hawkins CT angiogram of his chest ordered that showed no significant extrathoracic findings within the visualized chest.   He beef tacos prior. He had Hawkins BLT for one of the other events. He had Hawkins cheeseburger earlier in the day.  He had an extensive workup including Hawkins 24 hour urine for Addison's disease. This was all normal. He requested Hawkins lab to look for alpha gal and this was positive. He had Hawkins panel down that was on the low end with an alpha gal IgE.   I was able to see his labs that were sent through [facility].  It showed Hawkins normal 24-hour 5 HIAA level.  He also had normal urine metanephrines.  It appears that he had Hawkins work-up for hyperaldosteronism which was normal.  An Hawkins.m. cortisol level was normal.  He did have Hawkins positive alpha gal panel with an IgE to alpha-gal at 4.20, lamb 1.21, beef 1.60, and pork 0.86.  He has had multiple thyroid and lipiad and complete blood counts which have been largely normal.  He also has some sensitive skin.   He had eaten red meat in the past without Hawkins problem and that never seemed to bother him. He did end up  He has been having some issues with eating since this time. He has lost around 15 - 20 unintentionally. He is trying to get enough calories down. 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 He has Hawkins herniated disk in his neck that is not pushing on his spinal cords. THe neck stuff has been ongoing since April.    Asthma/Respiratory Symptom History: He has been having more sensitivity to scents and whatnot.  He had asthma as Hawkins child and he would routinely  need adrenaline shots as Hawkins kids. He does have the intermittent episodes of SOB. He has albuterol to use as needed. He does not cough Hawkins lot  Hawkins night. He has Symbicort that he does not use routinely.   Allergic Rhinitis Symptom History: He has had some sneezing and itchy eyes recently. He has had tickling in his throat. This did not happen until the taco incident.  He has had Hawkins lot of ringing in his ears. He was taking levocetirizine and montelukast and this never seemed to help. He has had Hawkins dry through likely from the [place]. He did have Hawkins strep test once that was negative.   Food Allergy Symptom History: He has not notied anything with regards to triggers. Two Saturdays ago ( July 29th), he was out doing some stuff with his daughter. They went to Hawkins 7pm movie and then he took some bottled water with him. He did not eat at the theater. The watched Hawkins new Spiderman movie. He took Hawkins sip of the slushie and then he started feeling this reaction coming on. He had not eaten anything since 11pm. He did have some   Otherwise, there is no history of other atopic diseases, including environmental allergies, stinging insect allergies, or contact dermatitis. There is no significant infectious history. Vaccinations are up to date.    Past Medical History: Patient Active Problem List   Diagnosis Date Noted   Anxiety state 12/24/2021   Asthmatic bronchitis 12/24/2021   DDD (degenerative disc disease), cervical 12/24/2021   Food allergy 12/24/2021   Hypertension 12/24/2021   Neck pain 12/24/2021   Paroxysmal tachycardia ([place]) 12/24/2021   Pure hypercholesterolemia 12/24/2021   Spondylolisthesis, lumbar region 12/24/2021   Spondylolysis 12/24/2021   Thoracic aortic ectasia ([place]) 12/24/2021   Vasovagal syncope 12/24/2021   Vitamin D deficiency 12/24/2021   Upper airway cough syndrome 02/28/2016   Bilateral inguinal hernia 08/21/2011    Medication List:  Allergies as of 12/24/2021       Reactions    Alpha-gal    Gelatin         Medication List        Accurate as of December 24, 2021  1:21 PM. If you have any questions, ask your nurse or doctor.          STOP taking these medications    fluticasone 50 MCG/ACT nasal spray Commonly known as: FLONASE Stopped by: Valentina Shaggy, MD   metoprolol tartrate 100 MG tablet Commonly known as: LOPRESSOR Stopped by: Valentina Shaggy, MD       TAKE these medications    albuterol 108 (90 Base) MCG/ACT inhaler Commonly known as: VENTOLIN HFA Inhale 2 puffs into the lungs as needed. What changed: how to take this Changed by: Valentina Shaggy, MD   budesonide-formoterol 80-4.5 MCG/ACT inhaler Commonly known as: Symbicort Inhale 2 puffs into the lungs as needed. What changed:  how much to take how to take this Changed by: Valentina Shaggy, MD   EPINEPHrine 0.3 mg/0.3 mL Soaj injection Commonly known as: EPI-PEN Inject 0.3 mg into the muscle as needed for anaphylaxis.   losartan 50 MG tablet Commonly known as: COZAAR Take 50 mg by mouth daily.   rosuvastatin 5 MG tablet Commonly known as: CRESTOR Take 5 mg by mouth daily.        Birth History: non-contributory  Developmental History: non-contributory  Past Surgical History: Past Surgical History:  Procedure Laterality Date   HERNIA REPAIR       Family History: No family history on file.   Social History: Helton lives at home with daughter.  They live in Hawkins house that is  50 years old.  There is carpeting in the main living areas including the bedrooms and the living room.  He has gas heating and central cooling.  He also has Hawkins heat pump.  There are dogs, cats, [place] pigs, and rabbits inside of the home.  There are no dust mite covers on the bedding.  There is no tobacco exposure.  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  He is not exposed to fumes, chemicals, or dust.  He is Hawkins Tax adviser.  He does have Hawkins HEPA filter in the  home.  He does not live near an interstate or industrial area.      Review of Systems  Constitutional: Negative.  Negative for chills, fever, malaise/fatigue and weight loss.  HENT:  Negative for ear discharge, ear pain and sinus pain.   Eyes:  Negative for pain, discharge and redness.  Respiratory:  Positive for cough and shortness of breath. Negative for sputum production and wheezing.   Cardiovascular: Negative.  Negative for chest pain and palpitations.  Gastrointestinal:  Positive for abdominal pain and nausea. Negative for constipation, diarrhea, heartburn and vomiting.  Skin:  Positive for itching and rash.  Neurological:  Negative for dizziness and headaches.  Endo/Heme/Allergies:  Positive for environmental allergies. Does not bruise/bleed easily.       Objective:   Blood pressure 130/80, pulse 76, temperature 98.3 F (36.8 C), temperature source Temporal, resp. rate 12, height 5' 6.54" (1.69 m), weight 145 lb 6.4 oz (66 kg), SpO2 97 %. Body mass index is 23.09 kg/m.     Physical Exam Vitals reviewed.  Constitutional:      Appearance: He is well-developed.     Comments: Thin male.  HENT:     Head: Normocephalic and atraumatic.     Right Ear: Tympanic membrane, ear canal and external ear normal. No drainage, swelling or tenderness. Tympanic membrane is not injected, scarred, erythematous, retracted or bulging.     Left Ear: Tympanic membrane, ear canal and external ear normal. No drainage, swelling or tenderness. Tympanic membrane is not injected, scarred, erythematous, retracted or bulging.     Nose: No nasal deformity, septal deviation, mucosal edema or rhinorrhea.     Right Turbinates: Enlarged, swollen and pale.     Left Turbinates: Enlarged, swollen and pale.     Right Sinus: No maxillary sinus tenderness or frontal sinus tenderness.     Left Sinus: No maxillary sinus tenderness or frontal sinus tenderness.     Mouth/Throat:     Lips: Pink.     Mouth: Mucous  membranes are moist. Mucous membranes are not pale and not dry.     Pharynx: Uvula midline.  Eyes:     General: Lids are normal. Allergic shiner present.        Right eye: No discharge.        Left eye: No discharge.     Conjunctiva/sclera: Conjunctivae normal.     Right eye: Right conjunctiva is not injected. No chemosis.    Left eye: Left conjunctiva is not injected. No chemosis.    Pupils: Pupils are equal, round, and reactive to light.  Cardiovascular:     Rate and Rhythm: Normal rate and regular rhythm.     Heart sounds: Normal heart sounds.  Pulmonary:     Effort: Pulmonary effort is normal. No tachypnea, accessory muscle usage or respiratory distress.     Breath sounds: Normal breath sounds. No wheezing, rhonchi or rales.  Comments: Moving air well in all lung fields. No increased work of breathing noted.  Chest:     Chest wall: No tenderness.  Abdominal:     Tenderness: There is no abdominal tenderness. There is no guarding or rebound.  Lymphadenopathy:     Head:     Right side of head: No submandibular, tonsillar or occipital adenopathy.     Left side of head: No submandibular, tonsillar or occipital adenopathy.     Cervical: No cervical adenopathy.  Skin:    Coloration: Skin is not pale.     Findings: No abrasion, erythema, petechiae or rash. Rash is not papular, urticarial or vesicular.  Neurological:     Mental Status: He is alert.  Psychiatric:        Behavior: Behavior is cooperative.      Diagnostic studies:    Spirometry: results normal (FEV1: 2.90/84%, FVC: 3.88/90%, FEV1/FVC: 75%).    Spirometry consistent with normal pattern.   Allergy Studies: none           Salvatore Marvel, MD [facility]

## 2021-12-25 MED ORDER — EPINEPHRINE 0.3 MG/0.3ML IJ SOAJ
0.3000 mg | INTRAMUSCULAR | 1 refills | Status: DC | PRN
Start: 2021-12-25 — End: 2023-02-23

## 2021-12-27 ENCOUNTER — Inpatient Hospital Stay: Payer: 59 | Admitting: Infectious Diseases

## 2021-12-27 LAB — ALPHA-GAL PANEL
Allergen Lamb IgE: 0.84 kU/L — AB
Beef IgE: 1.15 kU/L — AB
IgE (Immunoglobulin E), Serum: 21 IU/mL (ref 6–495)
O215-IgE Alpha-Gal: 3.03 kU/L — AB
Pork IgE: 0.65 kU/L — AB

## 2021-12-30 ENCOUNTER — Encounter: Payer: Self-pay | Admitting: Allergy & Immunology

## 2022-01-02 ENCOUNTER — Encounter: Payer: Self-pay | Admitting: Internal Medicine

## 2022-01-02 ENCOUNTER — Other Ambulatory Visit: Payer: Self-pay

## 2022-01-02 ENCOUNTER — Ambulatory Visit: Payer: 59 | Admitting: Internal Medicine

## 2022-01-02 VITALS — BP 135/89 | HR 80 | Temp 97.7°F | Ht 67.0 in | Wt 145.0 lb

## 2022-01-02 DIAGNOSIS — A770 Spotted fever due to Rickettsia rickettsii: Secondary | ICD-10-CM

## 2022-01-02 LAB — CBC WITH DIFFERENTIAL
Basophils Absolute: 0 10*3/uL (ref 0.0–0.2)
Basos: 0 %
EOS (ABSOLUTE): 0 10*3/uL (ref 0.0–0.4)
Eos: 1 %
Hematocrit: 44 % (ref 37.5–51.0)
Hemoglobin: 14.8 g/dL (ref 13.0–17.7)
Immature Grans (Abs): 0 10*3/uL (ref 0.0–0.1)
Immature Granulocytes: 0 %
Lymphocytes Absolute: 1.3 10*3/uL (ref 0.7–3.1)
Lymphs: 25 %
MCH: 29.5 pg (ref 26.6–33.0)
MCHC: 33.6 g/dL (ref 31.5–35.7)
MCV: 88 fL (ref 79–97)
Monocytes Absolute: 0.3 10*3/uL (ref 0.1–0.9)
Monocytes: 6 %
Neutrophils Absolute: 3.6 10*3/uL (ref 1.4–7.0)
Neutrophils: 68 %
RBC: 5.02 x10E6/uL (ref 4.14–5.80)
RDW: 12.7 % (ref 11.6–15.4)
WBC: 5.3 10*3/uL (ref 3.4–10.8)

## 2022-01-02 LAB — CHRONIC URTICARIA: cu index: <3.4 (ref <10)

## 2022-01-02 LAB — C-KIT MUTATION, LIQUID TUMOR

## 2022-01-02 LAB — THYROID ANTIBODIES
Thyroglobulin Antibody: <1 IU/mL (ref 0.0–0.9)
Thyroperoxidase Ab SerPl-aCnc: <9 IU/mL (ref 0–34)

## 2022-01-02 LAB — ANTINUCLEAR ANTIBODIES, IFA: ANA Titer 1: NEGATIVE

## 2022-01-02 LAB — SEDIMENTATION RATE: Sed Rate: 2 mm/hr (ref 0–30)

## 2022-01-02 LAB — TRYPTASE: Tryptase: 10.6 ug/L (ref 2.2–13.2)

## 2022-01-02 LAB — C-REACTIVE PROTEIN: CRP: <1 mg/L (ref 0–10)

## 2022-01-02 NOTE — Progress Notes (Signed)
[facility]  CHIEF COMPLAINT:    Follow up for labs that concerned for RMSF  SUBJECTIVE:    Tyler Hawkins is a 51 y.o. male with PMHx as below who presents to the clinic for labs concerning for RMSF.   Patient was seen by urgent care on 8/6 after recent positive test for Alpha gal a few months prior and general complaints of aches, tingling, fatigue, neurologic symptoms.  He has been evaluated by allergy and asthma for intermittent asthma and concern for mast cell activation.    At this urgent care he wanted to be checked for tick borne infections since he was bit by a tick about 3-4 months prior and had since been diagnosed with alpha gal.  He also reports that he has been plagued by multiple symptoms for 2 years and is part of a support group for this looking for answers.   In June, his RMSF IgM was negative, IgG EIA positive but reflex titer of <1:64.  On 8/6, his Lyme serology was negative and RMSF labs remained the same as in June.  He was started on doxycycline by PCP on 8/9 and reports taking this for a day, but then needed the liquid suspension because he did not tolerate the pills.  He is on a 5 day course of this and states he feels improved. His CBC and CMP have been normal.   Please see A&P for the details of today's visit and status of the patient's medical problems.   Patient's Medications  New Prescriptions   No medications on file  Previous Medications   ALBUTEROL (VENTOLIN HFA) 108 (90 BASE) MCG/ACT INHALER    Inhale 2 puffs into the lungs as needed.   BUDESONIDE-FORMOTEROL (SYMBICORT) 80-4.5 MCG/ACT INHALER    Inhale 2 puffs into the lungs as needed.   DOXYCYCLINE (VIBRAMYCIN) 50 MG/5ML SYRP    Take by mouth.   EPINEPHRINE 0.3 MG/0.3 ML IJ SOAJ INJECTION    Inject 0.3 mg into the muscle as needed for anaphylaxis.   LOSARTAN (COZAAR) 50 MG TABLET    Take 50 mg by mouth daily.   ROSUVASTATIN (CRESTOR) 5 MG TABLET    Take 5 mg by  mouth daily.  Modified Medications   No medications on file  Discontinued Medications   No medications on file      Past Medical History:  Diagnosis Date   Allergic rhinitis, cause unspecified    Asthma    childhood   Bronchitis    Exercise induced bronchospasm    Inguinal hernia without mention of obstruction or gangrene, unilateral or unspecified, (not specified as recurrent)    Tobacco use disorder     Social History   Tobacco Use   Smoking status: Former    Packs/day: 0.25    Types: Cigarettes    Quit date: 09/23/2011    Years since quitting: 10.2    Passive exposure: Past   Smokeless tobacco: Never  Vaping Use   Vaping Use: Never used  Substance Use Topics   Alcohol use: No   Drug use: No    No family history on file.  Allergies  Allergen Reactions   Alpha-Gal    Gelatin     Review of Systems  All other systems reviewed and are negative.  Except as noted above.   OBJECTIVE:    Vitals:   01/02/22 0848  BP: 135/89  Pulse: 80  Temp: 97.7 F (36.5 C)  TempSrc: Rectal  SpO2: 99%  Weight: 145 lb (65.8 kg)  Height: 5\' 7"  (1.702 m)   Body mass index is 22.71 kg/m.  Physical Exam Constitutional:      Appearance: Normal appearance.  HENT:     Head: Normocephalic and atraumatic.  Eyes:     Extraocular Movements: Extraocular movements intact.     Conjunctiva/sclera: Conjunctivae normal.  Pulmonary:     Effort: Pulmonary effort is normal. No respiratory distress.  Musculoskeletal:        General: Normal range of motion.     Cervical back: Normal range of motion and neck supple.  Skin:    General: Skin is warm and dry.     Findings: No rash.  Neurological:     General: No focal deficit present.     Mental Status: He is alert and oriented to person, place, and time.  Psychiatric:        Mood and Affect: Mood normal.        Behavior: Behavior normal.      Labs and Microbiology:    Latest Ref Rng & Units 12/24/2021   10:42 AM 11/12/2021    11:25 AM 02/11/2020    9:51 PM  CBC  WBC 3.4 - 10.8 x10E3/uL 5.3  6.3  8.7   Hemoglobin 13.0 - 17.7 g/dL 02/13/2020  78.2  95.6   Hematocrit 37.5 - 51.0 % 44.0  40.4  38.2   Platelets 150 - 400 K/uL  243  257       Latest Ref Rng & Units 11/21/2021    2:54 PM 11/12/2021   11:25 AM 02/11/2020    9:51 PM  CMP  Glucose 70 - 99 mg/dL 95  02/13/2020  086   BUN 6 - 24 mg/dL 12  13  20    Creatinine 0.76 - 1.27 mg/dL 578   4.69   Sodium 134 - 144 mmol/L 144  141  137   Potassium 3.5 - 5.2 mmol/L 4.3  3.9  3.7   Chloride 96 - 106 mmol/L 103  102  99   CO2 20 - 29 mmol/L 24  26  28    Calcium 8.7 - 10.2 mg/dL 9.5  9.8  8.9   Total Protein 6.5 - 8.1 g/dL  7.0    Total Bilirubin 0.3 - 1.2 mg/dL  0.7    Alkaline Phos 38 - 126 U/L  66    AST 15 - 41 U/L  17    ALT 0 - 44 U/L  17         ASSESSMENT & PLAN:      Patient here with recent alpha gal allergy and concern for RMSF.  We reviewed his testing and I feel he does not have nor did have RMSF previously.  His IgG EIA flagged positive however titer was < 1:64 in June and then convalescent serum 6 weeks later remains the same indicating a lack of 4-fold increase to suggest recent infection. His labs also showed normal CBC and CMP and RMSF does not cause a chronic disease syndrome.  Nonetheless he has taken doxycycline for the past 5-7 days which would be adequate treatment.  Recommend he stop doxycycline and follow up PRN.      5.28 for Infectious Disease [facility] 01/02/2022, 9:09 AM  I have personally spent 45 minutes involved in face-to-face and non-face-to-face activities for this patient on the day of the visit. Professional time spent includes the following  activities: Preparing to see the patient (review of tests), Obtaining and/or reviewing separately obtained history (admission/discharge record), Performing a medically appropriate examination and/or evaluation , Ordering  medications/tests/procedures, referring and communicating with other health care professionals, Documenting clinical information in the EMR, Independently interpreting results (not separately reported), Communicating results to the patient/family/caregiver, Counseling and educating the patient/family/caregiver and Care coordination (not separately reported).

## 2022-01-04 LAB — ALLERGENS W/COMP RFLX AREA 2
Alternaria Alternata IgE: <0.1 kU/L
Aspergillus Fumigatus IgE: <0.1 kU/L
Bermuda Grass IgE: <0.1 kU/L
Cedar, Mountain IgE: <0.1 kU/L
Cladosporium Herbarum IgE: <0.1 kU/L
Cockroach, German IgE: <0.1 kU/L
Common Silver Birch IgE: <0.1 kU/L
Cottonwood IgE: <0.1 kU/L
D Farinae IgE: <0.1 kU/L
D Pteronyssinus IgE: <0.1 kU/L
E001-IgE Cat Dander: <0.1 kU/L
E005-IgE Dog Dander: 0.11 kU/L — AB
Elm, American IgE: <0.1 kU/L
IgE (Immunoglobulin E), Serum: 21 IU/mL (ref 6–495)
Johnson Grass IgE: <0.1 kU/L
Maple/Box Elder IgE: <0.1 kU/L
Mouse Urine IgE: <0.1 kU/L
Oak, White IgE: <0.1 kU/L
Pecan, Hickory IgE: <0.1 kU/L
Penicillium Chrysogen IgE: <0.1 kU/L
Pigweed, Rough IgE: <0.1 kU/L
Ragweed, Short IgE: <0.1 kU/L
Sheep Sorrel IgE Qn: <0.1 kU/L
Timothy Grass IgE: <0.1 kU/L
White Mulberry IgE: <0.1 kU/L

## 2022-01-04 LAB — F297-IGE ACACIA GUM: F297-IgE Acacia Gum: <0.1 kU/L

## 2022-01-04 LAB — ALLERGEN GUM CARAGEENAN
Carageenan Gum, IgE: <0.35 kU/L (ref <0.35)
Class Interpretation: 0

## 2022-01-04 LAB — PROSTAGLANDIN D2/CREATININE, U
Creatinine, Urine: 73 mg/dL
Prostaglandin D2, urine: 10 pg/mL
Prostaglandin D2/Cr Ratio: 14 ng/g

## 2022-01-04 LAB — XANTHAN GUM IGE
Class Interpretation: 0
Xanthan Gum, IgE*: <0.35 kU/L (ref <0.35)

## 2022-01-06 ENCOUNTER — Telehealth: Payer: Self-pay | Admitting: Cardiology

## 2022-01-06 NOTE — Telephone Encounter (Signed)
Will forward to our PharmD team to further review and advise on medication question.  Pharmacist to send advisement back to triage.

## 2022-01-06 NOTE — Telephone Encounter (Signed)
Quercetin does not interact with rosuvastatin but safety and efficacy data with OTC products is limited.

## 2022-01-06 NOTE — Telephone Encounter (Signed)
  Per MyChart scheduling message:  Is it ok to take quercetin while taking rosuvastatin?  Thank you

## 2022-01-06 NOTE — Telephone Encounter (Signed)
Pt aware of Pharmacist recommendations.  He is aware that there is no interaction but the safety and efficacy data with OTC products is limited. Pt verbalized understanding and agrees with this plan.  Pt was appreciative for all the assistance provided.

## 2022-01-07 ENCOUNTER — Encounter: Payer: Self-pay | Admitting: Allergy & Immunology

## 2022-01-07 DIAGNOSIS — R5383 Other fatigue: Secondary | ICD-10-CM

## 2022-01-15 LAB — TICKBORNE DISEASE ANTIBODY PROFILE, SERUM
Babesia microti IgG: <1:10 {titer}
E.Chaffeensis (HME) IgG: NEGATIVE
HGE IgG Titer: NEGATIVE
Lyme Total Antibody EIA: NEGATIVE

## 2022-01-15 LAB — BABESIA MICROTI ANTIBODY PANEL: Babesia microti IgM: <1:10 {titer}

## 2022-01-15 LAB — BARTONELLA ANTIBODY PANEL
B Quintana IgM: NEGATIVE titer
B henselae IgG: NEGATIVE titer
B henselae IgM: NEGATIVE titer
B quintana IgG: NEGATIVE titer

## 2022-01-15 LAB — BABESIA MICROTI, REAL-TIME DNA PCR: Babesia microti PCR: NEGATIVE

## 2022-01-15 LAB — BARTONELLA, DNA, AMP PROBE: Bartonella DNA PCR: NEGATIVE

## 2022-01-19 NOTE — Progress Notes (Signed)
Cardiology Office Note:    Date:  01/27/2022   ID:  Tyler Hawkins, DOB [birth year], MRN [id number]  PCP:  Jonathon Jordan, MD   [facility] Providers Cardiologist:  Candee Furbish, MD     Referring MD: Jonathon Jordan, MD   Chief Complaint: follow-up palpitations  History of Present Illness:    Tyler Hawkins is a very pleasant 51 y.o. male with a hx of hypertension, presyncope, and asthma.    Referred to cardiology by PCP for evaluation of tachycardia and seen on 04/10/2020 by Dr. Marlou Porch.  He reported previous episode of drop in BP with elevated HR, 76/49, with pulse of 145.  He laid down and put feet up, symptoms resolved after 40 minutes.  Felt dizzy/hot, itching, tingling, had BM sensation.  I can had occurrence a few days later watching TV.  Felt flushing palpitations, dizziness.  Went to [facility], ER.  Troponin was normal.  Works for [employer].  He was advised to discontinue losartan.  Echocardiogram revealed normal pumping function, minimally dilated ascending aorta 38 mm.  No significant valve dysfunction.  Advised to repeat echo for follow-up ascending aorta in 1 year.  He contacted our office 08/28/2020 with low BP, dizziness, feeling lightheaded.  He was advised to increase fluid and salt intake and to continue to monitor BP.  Per Dr. Marlou Porch would start fludrocortisone if hypotension continued. Around this time losartan was discontinued by PCP.   CT cardiac score ordered by PCP is pending.  ER visit 11/12/21 for elevated blood pressure, rash.  ED provider was concerned that he had an anaphylactic reaction a few days prior and was still having allergic symptoms.  He was given prednisone 50 mg daily for 5 days. EKG and lab work was unremarkable.   He was last seen in our office on 11/21/2021 by me at which time he was very concerned about his symptoms, even tearful due to concerns. Since ER visit he has had a feeling of burning from the inside.  Was awakened suddenly at 0300 this morning with tightness in his chest. Different from previous episodes of burning in extremities, ears, funny sensation in his head and BP will drop very low that has occurred on 4 occasions since 2017. Never had any seizure-like activity to his knowledge. BP got as low as 68/38 on one occasion but he has never lost consciousness. His home BP readings over the past few days have been well-controlled. BP cuff calibrated at PCP office.  He has had no further episodes of hypotension since discontinuing losartan per records. He took his last dose of prednisone 7/5 in the morning, states he split the dose into 25 mg for the last 2 doses. Continues to exercise without chest pain or dyspnea. Feels an occasional flutter in his chest. No edema, orthopnea, or PND. Continues to work for [employer].   Event monitor revealed sinus rhythm with average HR 85 bpm, rare PAC, PVC.  No atrial fibrillation. Symptoms of fluttering/pounding were occasionally associated with PVCs.  Symptoms of chest pressure and heart racing associated with sinus rhythm/sinus tachycardia. Coronary CTA revealed coronary artery calcium score 0. Was diagnosed with Alpha Gal and RMSF.   Today, he is here alone for follow-up. Starting to feel more normal, has cleaned up diet, no animal products. Stopped taking losartan due to home BP 115-120/70-85. He denies chest pain, shortness of breath, lower extremity edema, fatigue, palpitations, diaphoresis, weakness, presyncope, syncope, orthopnea, and PND.  Past Medical  History:  Diagnosis Date   Allergic rhinitis, cause unspecified    Asthma    childhood   Bronchitis    Exercise induced bronchospasm    Inguinal hernia without mention of obstruction or gangrene, unilateral or unspecified, (not specified as recurrent)    Tobacco use disorder     Past Surgical History:  Procedure Laterality Date   HERNIA REPAIR      Current Medications: Current Meds   Medication Sig   albuterol (VENTOLIN HFA) 108 (90 Base) MCG/ACT inhaler Inhale 2 puffs into the lungs as needed.   budesonide-formoterol (SYMBICORT) 80-4.5 MCG/ACT inhaler Inhale 2 puffs into the lungs as needed.   EPINEPHrine 0.3 mg/0.3 mL IJ SOAJ injection Inject 0.3 mg into the muscle as needed for anaphylaxis.   rosuvastatin (CRESTOR) 5 MG tablet Take 5 mg by mouth daily.     Allergies:   Alpha-gal and Gelatin   Social History   Socioeconomic History   Marital status: Divorced    Spouse name: Not on file   Number of children: Not on file   Years of education: Not on file   Highest education level: Not on file  Occupational History   Not on file  Tobacco Use   Smoking status: Former    Packs/day: 0.25    Types: Cigarettes    Quit date: 09/23/2011    Years since quitting: 10.3    Passive exposure: Past   Smokeless tobacco: Never  Vaping Use   Vaping Use: Never used  Substance and Sexual Activity   Alcohol use: No   Drug use: No   Sexual activity: Not on file  Other Topics Concern   Not on file  Social History Narrative   Not on file   Social Determinants of Health   Financial Resource Strain: Not on file  Food Insecurity: Not on file  Transportation Needs: Not on file  Physical Activity: Not on file  Stress: Not on file  Social Connections: Not on file     Family History: The patient's family history is not on file.  ROS:   Please see the history of present illness.    + chest pain + palpitations All other systems reviewed and are negative.  Labs/Other Studies Reviewed:    The following studies were reviewed today:  Echo 05/02/21  1. Left ventricular ejection fraction, by estimation, is 55 to 60%. The  left ventricle has normal function. The left ventricle has no regional  wall motion abnormalities. Left ventricular diastolic parameters were  normal. The average left ventricular  global longitudinal strain is -21.4 %. The global longitudinal strain  is  normal.   2. Right ventricular systolic function is normal. The right ventricular  size is normal.   3. The mitral valve is normal in structure. No evidence of mitral valve  regurgitation. No evidence of mitral stenosis.   4. The aortic valve is tricuspid. Aortic valve regurgitation is trivial.  No aortic stenosis is present.   5. Aortic dilatation noted. There is borderline dilatation of the  ascending aorta, measuring 38 mm.   6. The inferior vena cava is normal in size with greater than 50%  respiratory variability, suggesting right atrial pressure of 3 mmHg.   Comparison(s): No significant change from prior study. 04/27/20 EF 60-65%  Ascending aorta 38 mm.   Echo 05/07/2020  1. Left ventricular ejection fraction, by estimation, is 60 to 65%. The  left ventricle has normal function. The left ventricle has no regional  wall  motion abnormalities. There is mild asymmetric left ventricular  hypertrophy of the basal-septal segment.  Left ventricular diastolic parameters are consistent with Grade I  diastolic dysfunction (impaired relaxation).   2. Right ventricular systolic function is normal. The right ventricular  size is normal. There is normal pulmonary artery systolic pressure.   3. The mitral valve is normal in structure. Trivial mitral valve  regurgitation. No evidence of mitral stenosis.   4. The aortic valve is tricuspid. Aortic valve regurgitation is trivial.  No aortic stenosis is present.   5. Aortic dilatation noted. There is mild dilatation of the ascending  aorta, measuring 38 mm.   6. The inferior vena cava is normal in size with greater than 50%  respiratory variability, suggesting right atrial pressure of 3 mmHg.   Recent Labs: 11/12/2021: ALT 17; Platelets 243 11/21/2021: BUN 12; Creatinine, Ser 1.12; Potassium 4.3; Sodium 144 12/24/2021: Hemoglobin 14.8  Recent Lipid Panel No results found for: "CHOL", "TRIG", "HDL", "CHOLHDL", "VLDL", "LDLCALC",  "LDLDIRECT"   Risk Assessment/Calculations:       Physical Exam:    VS:  BP 112/86   Pulse (!) 112   Ht 5\' 8"  (1.727 m)   Wt 155 lb (70.3 kg)   BMI 23.57 kg/m     Wt Readings from Last 3 Encounters:  01/27/22 155 lb (70.3 kg)  01/02/22 145 lb (65.8 kg)  12/24/21 145 lb 6.4 oz (66 kg)     GEN:  Well nourished, well developed in no acute distress HEENT: Normal NECK: No JVD; No carotid bruits CARDIAC: RRR, no murmurs, rubs, gallops RESPIRATORY:  Clear to auscultation without rales, wheezing or rhonchi  ABDOMEN: Soft, non-tender, non-distended MUSCULOSKELETAL:  No edema; No deformity. 2+ pedal pulses, equal bilaterally SKIN: Warm and dry NEUROLOGIC:  Alert and oriented x 3 PSYCHIATRIC:  Normal affect   EKG:  EKG is not ordered today.    Diagnoses:    1. Near syncope   2. Allergy to alpha-gal   3. Essential hypertension   4. Hyperlipidemia LDL goal <100   5. Blood pressure instability   6. Dilatation of thoracic aorta (HCC)     Assessment and Plan:     Palpitations: Quiescent at this time. Cardiac monitor revealed SR with average HR 85 bpm, rare PAC, PVC, no arrhythmia. No further work-up indicated at this time.   Mildly dilated ascending aorta: 38 mm on CCTA 11/2021. Will recheck in 1 year.   Presyncope: No further episodes of presyncope recently. Feels that symptoms have resolved with dietary changes since diagnosis of alpha gal allergy and RMSF.   Labile BP: BP is well-controlled today and at home. He stopped losartan. We will continue to hold.    Hyperlipidemia: LDL 78 on 10/21/21.  Coronary calcium score 0, no significant coronary artery disease noted on CCTA 12/10/2021. We discussed importance of maintaining LDL ideally < 70. He will continue low dose rosuvastatin. Continue heart healthy mostly plant based diet.   Disposition: 1 year with Dr. 12/12/2021   Medication Adjustments/Labs and Tests Ordered: Current medicines are reviewed at length with the patient  today.  Concerns regarding medicines are outlined above.  No orders of the defined types were placed in this encounter.  No orders of the defined types were placed in this encounter.   Patient Instructions  Medication Instructions:  Your physician recommends that you continue on your current medications as directed. Please refer to the Current Medication list given to you today.  *If you need a refill  on your cardiac medications before your next appointment, please call your pharmacy*   Lab Work: None Ordered If you have labs (blood work) drawn today and your tests are completely normal, you will receive your results only by: [place] (if you have MyChart) OR A paper copy in the mail If you have any lab test that is abnormal or we need to change your treatment, we will call you to review the results.   Testing/Procedures: None Ordered   Follow-Up: At [facility], you and your health needs are our priority.  As part of our continuing mission to provide you with exceptional heart care, we have created designated Provider Care Teams.  These Care Teams include your primary Cardiologist (physician) and Advanced Practice Providers (APPs -  Physician Assistants and Nurse Practitioners) who all work together to provide you with the care you need, when you need it.  We recommend signing up for the patient portal called "MyChart".  Sign up information is provided on this After Visit Summary.  MyChart is used to connect with patients for Virtual Visits (Telemedicine).  Patients are able to view lab/test results, encounter notes, upcoming appointments, etc.  Non-urgent messages can be sent to your provider as well.   To learn more about what you can do with MyChart, go to [email or web address].    Your next appointment:   1 year(s)  The format for your next appointment:   In Person  Provider:   Candee Furbish, MD     Other Instructions  Important Information About  Sugar         Signed, Emmaline Life, NP  01/27/2022 3:08 PM    [place]

## 2022-01-21 ENCOUNTER — Ambulatory Visit: Payer: Self-pay | Admitting: Allergy and Immunology

## 2022-01-27 ENCOUNTER — Encounter: Payer: Self-pay | Admitting: Allergy & Immunology

## 2022-01-27 ENCOUNTER — Encounter: Payer: Self-pay | Admitting: Nurse Practitioner

## 2022-01-27 ENCOUNTER — Ambulatory Visit: Payer: 59 | Attending: Nurse Practitioner | Admitting: Nurse Practitioner

## 2022-01-27 VITALS — BP 112/86 | HR 112 | Ht 68.0 in | Wt 155.0 lb

## 2022-01-27 DIAGNOSIS — Z91018 Allergy to other foods: Secondary | ICD-10-CM | POA: Diagnosis not present

## 2022-01-27 DIAGNOSIS — E785 Hyperlipidemia, unspecified: Secondary | ICD-10-CM | POA: Diagnosis not present

## 2022-01-27 DIAGNOSIS — I77810 Thoracic aortic ectasia: Secondary | ICD-10-CM

## 2022-01-27 DIAGNOSIS — I10 Essential (primary) hypertension: Secondary | ICD-10-CM | POA: Diagnosis not present

## 2022-01-27 DIAGNOSIS — R55 Syncope and collapse: Secondary | ICD-10-CM | POA: Diagnosis not present

## 2022-01-27 DIAGNOSIS — I998 Other disorder of circulatory system: Secondary | ICD-10-CM

## 2022-01-27 NOTE — Patient Instructions (Signed)
Medication Instructions:  Your physician recommends that you continue on your current medications as directed. Please refer to the Current Medication list given to you today.  *If you need a refill on your cardiac medications before your next appointment, please call your pharmacy*   Lab Work: None Ordered If you have labs (blood work) drawn today and your tests are completely normal, you will receive your results only by: MyChart Message (if you have MyChart) OR A paper copy in the mail If you have any lab test that is abnormal or we need to change your treatment, we will call you to review the results.   Testing/Procedures: None Ordered   Follow-Up: At [facility], you and your health needs are our priority.  As part of our continuing mission to provide you with exceptional heart care, we have created designated Provider Care Teams.  These Care Teams include your primary Cardiologist (physician) and Advanced Practice Providers (APPs -  Physician Assistants and Nurse Practitioners) who all work together to provide you with the care you need, when you need it.  We recommend signing up for the patient portal called "MyChart".  Sign up information is provided on this After Visit Summary.  MyChart is used to connect with patients for Virtual Visits (Telemedicine).  Patients are able to view lab/test results, encounter notes, upcoming appointments, etc.  Non-urgent messages can be sent to your provider as well.   To learn more about what you can do with MyChart, go to [email or web address].    Your next appointment:   1 year(s)  The format for your next appointment:   In Person  Provider:   Donato Schultz, MD     Other Instructions  Important Information About Sugar

## 2022-02-04 ENCOUNTER — Ambulatory Visit: Payer: 59 | Admitting: Allergy & Immunology

## 2022-02-20 ENCOUNTER — Ambulatory Visit: Payer: 59 | Admitting: Allergy & Immunology

## 2022-02-20 ENCOUNTER — Encounter: Payer: Self-pay | Admitting: Allergy & Immunology

## 2022-02-20 VITALS — BP 112/90 | HR 95 | Temp 99.4°F | Resp 12 | Wt 161.4 lb

## 2022-02-20 DIAGNOSIS — T7800XD Anaphylactic reaction due to unspecified food, subsequent encounter: Secondary | ICD-10-CM | POA: Diagnosis not present

## 2022-02-20 DIAGNOSIS — J4520 Mild intermittent asthma, uncomplicated: Secondary | ICD-10-CM | POA: Diagnosis not present

## 2022-02-20 NOTE — Patient Instructions (Addendum)
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  - I would definitely continue to avoid all mammalian meat and continue with the vegan products like you are doing.  - Drop the Benadryl and see how you do.   - In the meantime, continue with suppressive dosing of antihistamines:   - Morning: Allegra (fexofenadine) 180mg  tablet + Pepcid (famotidine) 20mg   - Evening: Pepcid (famotidine) 20mg  - Anaphylaxis management plan provided - DO NOT hesitate to use the EpiPen during the hypotension episodes (antihistamines take 20-30 minutes to work, epinephrine works in 3-5 minutes).  2. Intermittent asthma - Lung testing looked normal.  - Daily controller medication(s): NOTHING - Rescue medications OR changes during respiratory infections/worsening symptoms: albuterol 4 puffs every 4-6 hours OR Symbicort 80mg  two puffs every 4-6 hours as needed (MAX 16 puffs in one day) - Asthma control goals:  * Full participation in all desired activities (may need albuterol before activity) * Albuterol use two time or less a week on average (not counting use with activity) * Cough interfering with sleep two time or less a month * Oral steroids no more than once a year * No hospitalizations  3. Return in about 1 year (around 02/21/2023).    Please inform us of any Emergency Department visits, hospitalizations, or changes in symptoms. Call us before going to the ED for breathing or allergy symptoms since we might be able to fit you in for a sick visit. Feel free to contact us anytime with any questions, problems, or concerns.  It was a pleasure to see you today!    Websites that have reliable patient information: 1. American Academy of Asthma, Allergy, and Immunology: www.aaaai.org 2. Food Allergy Research and Education (FARE): foodallergy.org 3. Mothers of Asthmatics:  http://www.asthmacommunitynetwork.org 4. American College of Allergy, Asthma, and Immunology: www.acaai.org   COVID-19 Vaccine Information can be found at: ShippingScam.co.uk For questions related to vaccine distribution or appointments, please email [email or web address] or call [phone].   We realize that you might be concerned about having an allergic reaction to the COVID19 vaccines. To help with that concern, WE ARE OFFERING THE COVID19 VACCINES IN OUR OFFICE! Ask the front desk for dates!     "Like" [place] on Facebook and Instagram for our latest updates!      A healthy democracy works best when [employer] participate! Make sure you are registered to vote! If you have moved or changed any of your contact information, you will need to get this updated before voting!  In some cases, you MAY be able to register to vote online: [email or web address]

## 2022-02-20 NOTE — Progress Notes (Signed)
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  I think avoidance of the red meat has really done the trick.  He is also switched to a lot of vegan cosmetic products including toothpaste, which has helped his overall health.  He has lost a lot of weight with avoidance of red meat, but this has allowed him to get off of his hypertension medication as well as his hypercholesterolemia medication.  He is pretty happy with how he is doing.  We will see him again in a year or earlier if needed.  I did encourage him to try to drop the nighttime dose of Benadryl to see if this affects his symptoms.  I offered a registered dietitian referral, but he seems to have a good grasp on his nutrition.  We can certainly do this in the future if he thinks he needs some more input.  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  - I would definitely continue to avoid all mammalian meat and continue with the vegan products like you are doing.  - Drop the Benadryl and see how you do.   - In the meantime, continue with suppressive dosing of antihistamines:   - Morning: Allegra (fexofenadine) 185m tablet + Pepcid (famotidine) 232m - Evening: Pepcid (famotidine) 2060m Anaphylaxis management plan provided - DO NOT hesitate to use the EpiPen during the hypotension episodes (antihistamines take 20-30 minutes to work, epinephrine works in 3-5 minutes).  2. Intermittent asthma - Lung testing looked normal.  - Daily controller medication(s):  NOTHING - Rescue medications OR changes during respiratory infections/worsening symptoms: albuterol 4 puffs every 4-6 hours OR Symbicort 63m36mo puffs every 4-6 hours as needed (MAX 16 puffs in one day) - Asthma control goals:  * Full participation in all desired activities (may need albuterol before activity) * Albuterol use two time or less a week on average (not counting use with activity) * Cough interfering with sleep two time or less a month * Oral steroids no more than once a year * No hospitalizations  3. Return in about 1 year (around 02/21/2023).    Subjective:   Tyler Hawkins 51 y50. male presenting today for follow up of  Chief Complaint  Patient presents with   Follow-up    No issues currently    Tyler Hawkins a history of the following: Patient Active Problem List   Diagnosis Date Noted   Anxiety state 12/24/2021   Asthmatic bronchitis 12/24/2021   DDD (degenerative disc disease), cervical 12/24/2021   Food allergy 12/24/2021   Hypertension 12/24/2021   Neck pain 12/24/2021   Paroxysmal tachycardia (HCC)[place]/12/2021   Pure hypercholesterolemia 12/24/2021   Spondylolisthesis, lumbar region 12/24/2021   Spondylolysis 12/24/2021   Thoracic aortic ectasia (HCC)[place]/12/2021   Vasovagal syncope 12/24/2021   Vitamin D deficiency 12/24/2021   Upper airway cough syndrome 02/28/2016   Bilateral inguinal hernia 08/21/2011    History obtained from: chart review and patient.  Tyler Hawkins 51 y3. male  presenting for a follow up visit.  We last saw him in August 2023 as a new patient.  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  We obtained a slew of lab work including a C-kit mutation which was normal.  Environmental panel was notable only for a very mildly elevated IgE to dog.  Since the last visit, he has done very well.   He is still taking the Allegra and Pepcid in the morning. He also  takes Benadryl and Pepcid at night.  He is open to stopping the nighttime dose of Benadryl to see how he does.  He does not need to sleep.  He just does not more for anxiety about having a reaction in the middle of the night.  He seems to have done an excellent job of figuring out the alpha gal lifestyle.  He is plugged into a number of social media groups which have been able to help him navigate this new life. He drinks Body Armor and Celcius. He also drinks a lot ot water. He takes 8 different vitamins and minerals. He also drinks a plant based meal replacement.  He has found an alternative milk source.  He also occasionally splurges on emu and duck from [facility] in [place], [place]. The duck and emu is similar to beef in price.   He did get acupuncture for this and currently has needles placed behind his ears. His goal is not to have reactions to cross contamination. He is not really missing  the red meat at all.  He really just wants to minimize his reactions.  Right now he is eating a lot of his meals at home, almost 100% of them in fact.  He is holding steady at 151 pounds.   He has not been using his albuterol much at all.  He has been able to avoid certain areas that cause him to have coughing fits.  This is a lot of delayed and more normal life.  Otherwise, there have been no changes to his past medical history, surgical history, family history, or social history.    Review of Systems  Constitutional: Negative.  Negative for fever, malaise/fatigue and weight loss.  HENT: Negative.  Negative for congestion, ear discharge and ear pain.   Eyes:  Negative for pain, discharge and redness.  Respiratory:  Negative for cough, sputum production, shortness of breath and wheezing.   Cardiovascular: Negative.  Negative for chest pain and palpitations.  Gastrointestinal:  Negative for abdominal pain, heartburn, nausea and vomiting.  Skin: Negative.  Negative for itching and rash.  Neurological:   Negative for dizziness and headaches.  Endo/Heme/Allergies:  Negative for environmental allergies. Does not bruise/bleed easily.       Objective:   Blood pressure (!) 112/90, pulse 95, temperature 99.4 F (37.4 C), temperature source Temporal, resp. rate 12, weight 161 lb 6.4 oz (73.2 kg), SpO2 98 %. Body mass index is 24.54 kg/m.    Physical Exam Vitals reviewed.  Constitutional:      Appearance: Normal appearance. He is well-developed.  HENT:     Head: Normocephalic and atraumatic.     Right Ear: Tympanic membrane, ear canal and external ear normal.     Left Ear: Tympanic membrane, ear canal and external ear normal.     Nose: No nasal deformity, septal deviation, mucosal edema or rhinorrhea.     Right Turbinates: Not enlarged or swollen.     Left Turbinates:  Not enlarged or swollen.     Right Sinus: No maxillary sinus tenderness or frontal sinus tenderness.     Left Sinus: No maxillary sinus tenderness or frontal sinus tenderness.     Mouth/Throat:     Mouth: Mucous membranes are not pale and not dry.     Pharynx: Uvula midline.  Eyes:     General: Lids are normal. No allergic shiner.       Right eye: No discharge.        Left eye: No discharge.     Conjunctiva/sclera: Conjunctivae normal.     Right eye: Right conjunctiva is not injected. No chemosis.    Left eye: Left conjunctiva is not injected. No chemosis.    Pupils: Pupils are equal, round, and reactive to light.  Cardiovascular:     Rate and Rhythm: Normal rate and regular rhythm.     Heart sounds: Normal heart sounds.  Pulmonary:     Effort: Pulmonary effort is normal. No tachypnea, accessory muscle usage or respiratory distress.     Breath sounds: Normal breath sounds. No wheezing, rhonchi or rales.  Chest:     Chest wall: No tenderness.  Lymphadenopathy:     Cervical: No cervical adenopathy.  Skin:    General: Skin is warm.     Capillary Refill: Capillary refill takes less than 2 seconds.     Coloration:  Skin is not pale.     Findings: No abrasion, erythema, petechiae or rash. Rash is not papular, urticarial or vesicular.     Comments: Multiple tattoos.  Neurological:     Mental Status: He is alert.  Psychiatric:        Behavior: Behavior is cooperative.      Diagnostic studies: none       Salvatore Marvel, MD  [facility]

## 2022-02-21 ENCOUNTER — Encounter: Payer: Self-pay | Admitting: Allergy & Immunology

## 2022-09-08 ENCOUNTER — Encounter: Payer: Self-pay | Admitting: Cardiology

## 2023-02-22 ENCOUNTER — Other Ambulatory Visit: Payer: Self-pay | Admitting: Allergy & Immunology

## 2023-02-23 ENCOUNTER — Other Ambulatory Visit: Payer: Self-pay | Admitting: Allergy & Immunology

## 2023-03-10 ENCOUNTER — Ambulatory Visit: Payer: 59 | Attending: Cardiology | Admitting: Cardiology

## 2023-03-10 ENCOUNTER — Encounter: Payer: Self-pay | Admitting: Cardiology

## 2023-03-10 VITALS — BP 130/100 | HR 67 | Ht 68.0 in | Wt 153.2 lb

## 2023-03-10 DIAGNOSIS — I77810 Thoracic aortic ectasia: Secondary | ICD-10-CM | POA: Diagnosis not present

## 2023-03-10 DIAGNOSIS — Z91018 Allergy to other foods: Secondary | ICD-10-CM | POA: Diagnosis not present

## 2023-03-10 DIAGNOSIS — I10 Essential (primary) hypertension: Secondary | ICD-10-CM | POA: Diagnosis not present

## 2023-03-10 NOTE — Progress Notes (Signed)
Cardiology Office Note:  .   Date:  03/10/2023  ID:  Tyler Hawkins, DOB 07-28-1970, MRN [id number] PCP: Mattie Marlin, DO  [facility] Providers Cardiologist:  Donato Schultz, MD     History of Present Illness: .   Tyler Hawkins is a 52 y.o. male Discussed the use of AI scribe  History of Present Illness   The 52 year old patient, with a history of tachycardia, hypertension, presyncope, and asthma, presents for a follow-up visit. He previously experienced a significant drop in blood pressure to 76/49 mmHg with an elevated heart rate of 140 bpm. Accompanying symptoms included dizziness, heat sensation, itching, tingling, and an upset stomach. These symptoms resolved after approximately 40 minutes of lying down with feet elevated.  A few days later, the patient experienced a similar episode of flushing, palpitations, and dizziness while watching television, prompting a visit to the emergency room. At that time, he was advised to discontinue losartan.  The patient's past medical investigations include an echocardiogram in 2022, which showed normal cardiac function and a mildly dilated ascending aorta (38mm), with no valvular dysfunction. An event monitor showed an average heart rate of 85 bpm, rare PVCs and PACs, and no atrial fibrillation. A coronary CTA revealed a calcium score of zero.  The patient was eventually diagnosed with alpha-gal and [facility] spotted fever. After adopting a diet free of animal products and discontinuing losartan, the patient reported significant improvement in his symptoms. For hyperlipidemia, the patient is on a low dose of Crestor (5mg /day).   His daughter Tyler Hawkins rides horses at [employer], she is wearing Isabel's vest         ROS: No CP  Studies Reviewed: Marland Kitchen   EKG Interpretation Date/Time:  Tuesday March 10 2023 09:00:25 EDT Ventricular Rate:  67 PR Interval:  162 QRS Duration:  92 QT Interval:  380 QTC Calculation: 401 R  Axis:   -47  Text Interpretation: Normal sinus rhythm Left axis deviation When compared with ECG of 12-Nov-2021 11:18, Abberant conduction is no longer Present Confirmed by Donato Schultz ([id number]) on 03/10/2023 9:18:21 AM    Results RADIOLOGY Coronary CTA 2023: Calcium score of 0, No plaque. No CAD. Aorta 38 mm  DIAGNOSTIC Echocardiogram: Normal pumping function, mildly dilated ascending aorta 38 mm, no valvular dysfunction (2022) Event monitor: Average heart rate 85 bpm, rare PVCs and PACs, no atrial fibrillation  Risk Assessment/Calculations:           Physical Exam:   VS:  BP (!) 130/100 Comment: Left arm 142/100  Pulse 67   Ht 5\' 8"  (1.727 m)   Wt 153 lb 3.2 oz (69.5 kg)   SpO2 99%   BMI 23.29 kg/m    Wt Readings from Last 3 Encounters:  03/10/23 153 lb 3.2 oz (69.5 kg)  02/20/22 161 lb 6.4 oz (73.2 kg)  01/27/22 155 lb (70.3 kg)    GEN: Well nourished, well developed in no acute distress NECK: No JVD; No carotid bruits CARDIAC: RRR, no murmurs, no rubs, no gallops RESPIRATORY:  Clear to auscultation without rales, wheezing or rhonchi  ABDOMEN: Soft, non-tender, non-distended EXTREMITIES:  No edema; No deformity   ASSESSMENT AND PLAN: .    Assessment and Plan    Tachycardia and Hypotension Episodes of tachycardia and hypotension with associated symptoms of dizziness, flushing, palpitations, and upset stomach. Symptoms resolved after discontinuing Losartan and dietary changes due to alpha-gal and [facility] spotted fever diagnosis. -Continue current management plan and monitor for recurrence  of symptoms.  In office hypertension - Elevated today.  At home normal.  Continue to monitor.   Hyperlipidemia -LDL 110, LDL on 5 mg of Crestor was 78.  Since he does not have any plaque, okay to continue with diet and exercise.  Asthma No recent exacerbations or changes in symptoms mentioned. -Continue current management plan and monitor for changes in  symptoms.  Alpha-gal and [facility] spotted fever Symptoms improved after dietary changes and discontinuation of Losartan. -Continue current management plan and monitor for changes in symptoms.             Dispo: 2 year follow-up.  Check echocardiogram in 2 years.  Signed, Donato Schultz, MD

## 2023-03-10 NOTE — Patient Instructions (Signed)
Medication Instructions:  The current medical regimen is effective;  continue present plan and medications.  *If you need a refill on your cardiac medications before your next appointment, please call your pharmacy*  Testing/Procedures: Your physician has requested that you have an echocardiogram in 2 years. Echocardiography is a painless test that uses sound waves to create images of your heart. It provides your doctor with information about the size and shape of your heart and how well your heart's chambers and valves are working. This procedure takes approximately one hour. There are no restrictions for this procedure. Please do NOT wear cologne, perfume, aftershave, or lotions (deodorant is allowed). Please arrive 15 minutes prior to your appointment time.   Follow-Up: At [facility], you and your health needs are our priority.  As part of our continuing mission to provide you with exceptional heart care, we have created designated Provider Care Teams.  These Care Teams include your primary Cardiologist (physician) and Advanced Practice Providers (APPs -  Physician Assistants and Nurse Practitioners) who all work together to provide you with the care you need, when you need it.  We recommend signing up for the patient portal called "MyChart".  Sign up information is provided on this After Visit Summary.  MyChart is used to connect with patients for Virtual Visits (Telemedicine).  Patients are able to view lab/test results, encounter notes, upcoming appointments, etc.  Non-urgent messages can be sent to your provider as well.   To learn more about what you can do with MyChart, go to [email or web address].    Your next appointment:   2 year(s)  Provider:   Donato Schultz, MD

## 2023-05-25 ENCOUNTER — Encounter (INDEPENDENT_AMBULATORY_CARE_PROVIDER_SITE_OTHER): Payer: 59 | Admitting: Cardiology

## 2023-05-25 DIAGNOSIS — I479 Paroxysmal tachycardia, unspecified: Secondary | ICD-10-CM

## 2023-05-26 ENCOUNTER — Telehealth: Payer: Self-pay | Admitting: Cardiology

## 2023-05-26 ENCOUNTER — Ambulatory Visit (INDEPENDENT_AMBULATORY_CARE_PROVIDER_SITE_OTHER): Payer: 59

## 2023-05-26 ENCOUNTER — Ambulatory Visit: Payer: 59 | Attending: Cardiology

## 2023-05-26 VITALS — HR 78 | Wt 146.0 lb

## 2023-05-26 DIAGNOSIS — R002 Palpitations: Secondary | ICD-10-CM

## 2023-05-26 DIAGNOSIS — I479 Paroxysmal tachycardia, unspecified: Secondary | ICD-10-CM

## 2023-05-26 NOTE — Telephone Encounter (Signed)
 Patient c/o Palpitations:  STAT if patient reporting lightheadedness, shortness of breath, or chest pain  How long have you had palpitations/irregular HR/ Afib? Are you having the symptoms now? Last night  Are you currently experiencing lightheadedness, SOB or CP? SOB detected on watch  Do you have a history of afib (atrial fibrillation) or irregular heart rhythm? No   Have you checked your BP or HR? (document readings if available): 113/76; 69  Are you experiencing any other symptoms? No

## 2023-05-26 NOTE — Progress Notes (Signed)
   Nurse Visit   Date of Encounter: 05/26/2023 ID: Tyler Hawkins, DOB [birth year], MRN [id number]  PCP:  Macarthur Elouise SQUIBB, DO    [facility] Prov[facility] Cardiologist:  Oneil Parchment, MD      Visit Details   VS:  Pulse 78   Wt 146 lb (66.2 kg)   BMI 22.20 kg/m  , BMI Body mass index is 22.2 kg/m.  Wt Readings from Last 3 Encounters:  05/26/23 146 lb (66.2 kg)  03/10/23 153 lb 3.2 oz (69.5 kg)  02/20/22 161 lb 6.4 oz (73.2 kg)     Reason for visit: EKG Performed today:   , Vitals, EKG, Provider consulted:Dr.Turner, and Education Changes (medications, testing, etc.) : Zio monitor 14 days Length of Visit: 10 minutes    Medications Adjustments/Labs and Tests Ordered: Orders Placed This Encounter  Procedures   LONG TERM MONITOR (3-14 DAYS)   EKG 12-Lead   EKG 12-Lead   Patient is aware a 14 day monitor will be ordered and sent to his home.    Signed, Tiffay Pinette JONELLE Kleine, LPN  12/18/7972 4:57 PM   Addendum:  Patient called in stating he thinks he is atrial fibrillation.  EKG today reviewed and demonstrates NSR.  Recommend 2 week ziopatch for further evaluation and followup with Dr. Parchment pending results.

## 2023-05-26 NOTE — Telephone Encounter (Signed)
 Called patient back about his message. Patient stated he started having palpitations and fluttering on Sunday and Monday, still having them today off and on, but not as bad.  Patient's watch reported A. FIB last night. DOD, Dr. Shlomo would like him to come in for EKG with the nurse and then if he is in A. FIB she will see him. Patient agreed to plan and will come in for nurse visit today.

## 2023-05-26 NOTE — Progress Notes (Unsigned)
 Enrolled patient for a 14 day Zio XT  monitor to be mailed to patients home

## 2023-05-27 MED ORDER — METOPROLOL TARTRATE 25 MG PO TABS: 25.0000 mg | ORAL_TABLET | Freq: Two times a day (BID) | ORAL | 3 refills | Status: DC | PRN

## 2023-05-27 NOTE — Telephone Encounter (Signed)
 Please see the MyChart message reply(ies) for my assessment and plan.   I have personally reviewed your Apple Watch tracing.  I do not believe that this is atrial fibrillation.  There is artifact present throughout the tracing and my interpretation would be sinus rhythm with occasional PACs or premature atrial contractions.  Your overall heart rate is excellent.  As you are aware, I have given you metoprolol  tartrate which is show a short acting beta blocker to take only on an as-needed basis if your heart is racing.  Of course you can try conservative manage strength strategies such as hydration, deep breathing.  I am aware that your blood pressure usually runs low so being on a longer term maintenance therapy may not be advantageous for you.   This patient gave consent for this Medical Advice Message and is aware that it may result in a bill to [employer], as well as the possibility of receiving a bill for a co-payment or deductible. They are an established patient, but are not seeking medical advice exclusively about a problem treated during an in person or video visit in the last seven days. I did not recommend an in person or video visit within seven days of my reply.    I spent a total of 6 minutes cumulative time within 7 days through [employer].  Oneil Parchment, MD

## 2023-05-27 NOTE — Telephone Encounter (Signed)
 Pt sent this Via mychart to the scheduling pool this morning:     Woke at 144am rapid heart beat

## 2023-05-27 NOTE — Addendum Note (Signed)
 Addended by: Cherylann Banas on: 05/27/2023 03:22 PM   Modules accepted: Orders

## 2023-05-27 NOTE — Telephone Encounter (Signed)
 Order placed for Metoprolol Tartrate (Lopressor) 25 mg BID PRN per Dr. Anne Fu. Pt told to take this medication twice daily as needed if he has the palpitations again. Pt verbalized understanding of plan and denied any further questions at this time.

## 2023-05-27 NOTE — Telephone Encounter (Signed)
 Please see documentation for nurse visit 05/26/23.  Pt in NSR.  Zio patch monitor ordered.

## 2023-06-01 DIAGNOSIS — R002 Palpitations: Secondary | ICD-10-CM | POA: Diagnosis not present

## 2023-06-17 ENCOUNTER — Encounter: Payer: Self-pay | Admitting: Cardiology

## 2023-06-17 DIAGNOSIS — I998 Other disorder of circulatory system: Secondary | ICD-10-CM

## 2023-06-17 DIAGNOSIS — I10 Essential (primary) hypertension: Secondary | ICD-10-CM

## 2023-06-17 DIAGNOSIS — R002 Palpitations: Secondary | ICD-10-CM

## 2023-06-29 ENCOUNTER — Encounter (HOSPITAL_COMMUNITY): Payer: Self-pay

## 2023-07-07 ENCOUNTER — Ambulatory Visit (HOSPITAL_BASED_OUTPATIENT_CLINIC_OR_DEPARTMENT_OTHER): Payer: 59

## 2023-07-07 ENCOUNTER — Ambulatory Visit (HOSPITAL_COMMUNITY): Payer: 59 | Attending: Cardiology

## 2023-07-07 ENCOUNTER — Encounter: Payer: Self-pay | Admitting: Cardiology

## 2023-07-07 DIAGNOSIS — I10 Essential (primary) hypertension: Secondary | ICD-10-CM

## 2023-07-07 DIAGNOSIS — I998 Other disorder of circulatory system: Secondary | ICD-10-CM

## 2023-07-07 DIAGNOSIS — R002 Palpitations: Secondary | ICD-10-CM | POA: Diagnosis not present

## 2023-07-07 LAB — EXERCISE TOLERANCE TEST
Base ST Depression (mm): 0 mm
Estimated workload: 10.1
Exercise duration (min): 8 min
Exercise duration (sec): 1 s
MPHR: 169 {beats}/min
Peak HR: 181 {beats}/min
Percent HR: 107 %
Rest HR: 94 {beats}/min
ST Depression (mm): 0 mm

## 2023-07-07 LAB — ECHOCARDIOGRAM COMPLETE
Area-P 1/2: 4.4 cm2
S' Lateral: 2.8 cm

## 2023-07-08 ENCOUNTER — Ambulatory Visit (HOSPITAL_COMMUNITY): Payer: 59

## 2023-07-09 ENCOUNTER — Encounter: Payer: Self-pay | Admitting: Cardiology

## 2023-09-15 ENCOUNTER — Ambulatory Visit: Admitting: Allergy & Immunology

## 2023-10-06 ENCOUNTER — Other Ambulatory Visit: Payer: Self-pay

## 2023-10-06 ENCOUNTER — Ambulatory Visit: Admitting: Family

## 2023-10-06 ENCOUNTER — Encounter: Payer: Self-pay | Admitting: Family

## 2023-10-06 VITALS — BP 132/80 | HR 75 | Temp 98.2°F | Resp 16 | Ht 68.0 in | Wt 149.2 lb

## 2023-10-06 DIAGNOSIS — T7800XD Anaphylactic reaction due to unspecified food, subsequent encounter: Secondary | ICD-10-CM | POA: Diagnosis not present

## 2023-10-06 DIAGNOSIS — J4520 Mild intermittent asthma, uncomplicated: Secondary | ICD-10-CM | POA: Diagnosis not present

## 2023-10-06 DIAGNOSIS — J3089 Other allergic rhinitis: Secondary | ICD-10-CM

## 2023-10-06 MED ORDER — EPINEPHRINE 0.3 MG/0.3ML IJ SOAJ: 0.3000 mg | INTRAMUSCULAR | 1 refills | Status: AC | PRN

## 2023-10-06 MED ORDER — ALBUTEROL SULFATE HFA 108 (90 BASE) MCG/ACT IN AERS: 2.0000 | INHALATION_SPRAY | RESPIRATORY_TRACT | 1 refills | Status: AC | PRN

## 2023-10-06 NOTE — Progress Notes (Signed)
 [address] [place] [address] Dept: [phone]  FOLLOW UP NOTE  Patient ID: Tyler Hawkins, male    DOB: 07-11-1970  Age: 53 y.o. MRN: [id number] Date of Office Visit: 10/06/2023  Assessment  Chief Complaint: Allergy Testing and Nasal Congestion  HPI Tyler Hawkins is a 53 year old male who presents today for possible skin testing to environmental allergens.  He was last seen on February 20, 2022 by Dr. Idolina Maker for allergic reaction, intermittent asthma, normal echocardiogram and reassuring cardiac score, reactive airway dysfunction syndrome-with sensitivity to scents.  He reports that for as long as he can remember he has had issues with his sinuses.  He reports constant postnasal drip, nasal congestion, and rhinorrhea.  He mentions approximately 6 to 7 years ago he had skin testing in the past did not show he was allergic to anything.  He has concerns about whether we be able to do skin testing today due to having tattoos all over his back and down both of his arms.  He takes Allegra twice a day and Zantac twice a day.  He used to take Pepcid  twice a day, but stopped due to them not being able to guarantee that it is mammalian free.  He has used Flonase in the past consistently and could not tell a difference.  He is able to taste and smell.  He has not had any sinus surgeries.  He has seen ENT recently and reports that he has had CT scans that did not show any blockage approximately 6 months ago.  He saw ENT due to having headaches.  He was diagnosed with having bruxism and now wears invisible line to help prevent him grinding his teeth.  His lab work to environmental allergens in December 24, 2021 was only elevated to dog (0.11).  He did not remember having the lab work to environmental allergens done then.  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  His imaging from December 2024 shows:  CT Facial Bones W Contrast  Anatomical Region Laterality Modality  Skull -- Computed Tomography    Impression   1.  Stable size and appearance of a nonspecific 12 mm ovoid soft tissue lesion in the left parapharyngeal space without concerning imaging features appreciated. Differential considerations remain broad, with nonaggressive salivary origin neoplasm (e.g., benign mixed tumor/pleomorphic adenoma) most statistically favored in this location. 2.  No enlarged or morphologically suspicious lymph nodes in the upper neck. Narrative  CT FACE/SINUSES WITH CONTRAST, 05/04/2023 11:11 AM  INDICATION: parapharyngeal space mass, parapharyngeal space mass, Localized swelling, mass and lump, neck \ R22.1 Localized swelling, mass and lump, neck parapharyngeal space mass  COMPARISON: CT dated 01/16/2023. MRI dated 12/11/2022.  TECHNIQUE: High-resolution axial and/or coronal CT images of the maxillofacial region were obtained after intravenous administration of iodine-based contrast, using 2D reformations as needed. Portions of the brain, skull base, and upper neck were also included in the imaging field.  All CT scans at [facility] and [facility] are performed using radiation dose optimization techniques as appropriate to a performed exam, including but not limited to one or more of the following: automatic exposure control, adjustment of the mA and/or kV according to patient size, use of iterative reconstruction technique. In addition, our institution participates in a radiation dose monitoring program to optimize patient radiation exposure.   FINDINGS:  Superficial soft tissues: No focal lesion identified.  Orbits: Globes are bilaterally symmetric in size and contour. Symmetric appearance of  the extraocular muscles and orbital fat.  Sinuses: No air-fluid levels or substantial mucosal disease.  Midface/nasal cavity: No mass lesion identified. Similar bilateral maxillary tori.  Mandible: No aggressive lesion identified. Similar bilateral mandibular  tori.  Additional findings: Stable size and appearance of the circumscribed ovoid lesion in the left parapharyngeal space measuring approximately 12 x 7 mm without discernible enhancement on postcontrast imaging. No significant corresponding mass effect. This lesion again closely approximates likely abuts the distal left cervical ICA and proximal aspect of the left internal jugular vein without significant luminal narrowing. Partially empty sella, similar to prior. 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  He was switched to liquid doxycycline which he tolerated.  Otherwise he continues to avoid red meat and has access to his epinephrine  autoinjector device.  He mentions that he has to check with [email or web address] to make sure all his medicines are mammalian free.  His blood pressure does still go up and down at times.  If his blood pressure is greater than 140/90 he does take a pill for his blood pressure.  Intermittent asthma: He denies cough, wheeze, tightness in chest, shortness of breath, and nocturnal awakenings due to breathing problems.  Since his last office visit he denies any requirement for systemic steroids or made any trips to the emergency room or urgent care due to breathing problems.  It does however show in Epic that in December 2024 he was given steroid injection for vira upper respiratory infection with cough.  He reports that it has been over a year since he has last used albuterol  or Symbicort .  He would like refill on albuterol .   Drug Allergies:  Allergies  Allergen Reactions   Alpha-Gal    Gelatin     Review of Systems: Negative except as per HPI   Physical Exam: BP 132/80   Pulse 75   Temp 98.2 F (36.8 C)   Resp 16   Ht 5\' 8"  (1.727 m)   Wt 149 lb 3.2 oz (67.7 kg)    SpO2 97%   BMI 22.69 kg/m    Physical Exam Constitutional:      Appearance: Normal appearance.  HENT:     Head: Normocephalic and atraumatic.     Comments: Pharynx normal, eyes normal, ears normal, nose: Bilateral lower turbinates mildly edematous with no drainage noted.  Slight left septal deviation noted    Right Ear: Tympanic membrane, ear canal and external ear normal.     Left Ear: Tympanic membrane, ear canal and external ear normal.     Mouth/Throat:     Mouth: Mucous membranes are moist.     Pharynx: Oropharynx is clear.  Eyes:     Conjunctiva/sclera: Conjunctivae normal.  Cardiovascular:     Rate and Rhythm: Regular rhythm.     Heart sounds: Normal heart sounds.  Pulmonary:     Effort: Pulmonary effort is normal.     Breath sounds: Normal breath sounds.     Comments: Lungs clear to auscultation Musculoskeletal:     Cervical back: Neck supple.  Skin:    General: Skin is warm.  Neurological:     Mental Status: He is alert and oriented to person, place, and time.  Psychiatric:        Mood and Affect: Mood normal.  Behavior: Behavior normal.        Thought Content: Thought content normal.        Judgment: Judgment normal.     Diagnostics: FVC 4.05 L (88%), FEV1 3.18 L (87%), FEV1/FVC 0.79.  Spirometry indicates normal spirometry.  Assessment and Plan: 1. Perennial allergic rhinitis   2. Mild intermittent asthma, uncomplicated   3. Anaphylactic shock due to food, subsequent encounter     Meds ordered this encounter  Medications   EPINEPHrine  0.3 mg/0.3 mL IJ SOAJ injection    Sig: Inject 0.3 mg into the muscle as needed for anaphylaxis.    Dispense:  2 each    Refill:  1    Patient should schedule an appointment with provider for further refills.   albuterol  (VENTOLIN  HFA) 108 (90 Base) MCG/ACT inhaler    Sig: Inhale 2 puffs into the lungs as needed.    Dispense:  6.7 g    Refill:  1    Please give him NDC 76204-010-01, NDC 76204-010-05, or NDC  76204-010-11. 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  - I would definitely continue to avoid all mammalian meat and continue with the vegan products like you are doing. We will get lab work to follow up on your alpha gal allergy.We will call you with results once they are back.  - Refill Epipen  sent  - continue with suppressive dosing of antihistamines:   - Morning: Allegra (fexofenadine) 180mg  tablet + Zantac  - Evening: Zantac - Anaphylaxis management plan provided - DO NOT hesitate to use the EpiPen  during the hypotension episodes (antihistamines take 20-30 minutes to work, epinephrine  works in 3-5 minutes).  2. Intermittent asthma-no use of albuterol  or Symbicort  in over a year - Lung testing looked normal.  - Daily controller medication(s): NOTHING - Rescue medications OR changes during respiratory infections/worsening symptoms: albuterol  4 puffs every 4-6 hours OR Symbicort  80mg  two puffs every 4-6 hours as needed (MAX 16 puffs in one day) - Asthma control goals:  * Full participation in all desired activities (may need albuterol  before activity) * Albuterol  use two time or less a week on average (not counting use with activity) * Cough interfering with sleep two time or less a month * Oral steroids no more than once a year * No hospitalizations  3.Perennial allergic rhinitis - After contacting [facility] let us  know if we can provide you with a sample of Xhance or send in a prescription for azelastine nasal spray Continue Allegra as above -August 2023 lab work to environmental allergens only mildly elevated to dog.  He reports skin testing approximately 6 to 7 years ago was also negative.  (I do not have these results to review)     Follow up in 6-12 months or sooner if needed   Please inform us  of any  Emergency Department visits, hospitalizations, or changes in symptoms. Call us  before going to the ED for breathing or allergy symptoms since we might be able to fit you in for a sick visit. Feel free to contact us  anytime with any questions, problems, or concerns.  It was a pleasure meeting you today!    Websites that have reliable patient information: 1. American Academy of Asthma, Allergy, and Immunology: www.aaaai.org 2. Food Allergy Research and Education (FARE): foodallergy.org 3. Mothers of Asthmatics: http://www.asthmacommunitynetwork.org 4. American  College of Allergy, Asthma, and Immunology: www.acaai.org       Return in about 1 year (around 10/05/2024), or if symptoms worsen or fail to improve.    Thank you for the opportunity to care for this patient.  Please do not hesitate to contact me with questions.  Tinnie Forehand, FNP [facility] 

## 2023-10-06 NOTE — Patient Instructions (Addendum)
 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  - I would definitely continue to avoid all mammalian meat and continue with the vegan products like you are doing. We will get lab work to follow up on your alpha gal allergy.We will call you with results once they are back.  - Refill Epipen  sent  - continue with suppressive dosing of antihistamines:   - Morning: Allegra (fexofenadine) 180mg  tablet + Zantac  - Evening: Zantac - Anaphylaxis management plan provided - DO NOT hesitate to use the EpiPen  during the hypotension episodes (antihistamines take 20-30 minutes to work, epinephrine  works in 3-5 minutes).  2. Intermittent asthma-no use of albuterol  or Symbicort  in over a year - Lung testing looked normal.  - Daily controller medication(s): NOTHING - Rescue medications OR changes during respiratory infections/worsening symptoms: albuterol  4 puffs every 4-6 hours OR Symbicort  80mg  two puffs every 4-6 hours as needed (MAX 16 puffs in one day) - Asthma control goals:  * Full participation in all desired activities (may need albuterol  before activity) * Albuterol  use two time or less a week on average (not counting use with activity) * Cough interfering with sleep two time or less a month * Oral steroids no more than once a year * No hospitalizations  3.Perennial allergic rhinitis - After contacting [facility] let us  know if we can provide you with a sample of Xhance or send in a prescription for azelastine nasal spray Continue Allegra as above -August 2023 lab work to environmental allergens only mildly elevated to dog.  He reports skin testing approximately 6 to 7 years ago was also negative.  (I do not have these results to review)     Follow up in 6-12 months or sooner if needed   Please inform us  of any Emergency Department visits, hospitalizations, or changes in  symptoms. Call us  before going to the ED for breathing or allergy symptoms since we might be able to fit you in for a sick visit. Feel free to contact us  anytime with any questions, problems, or concerns.  It was a pleasure meeting you today!    Websites that have reliable patient information: 1. American Academy of Asthma, Allergy, and Immunology: www.aaaai.org 2. Food Allergy Research and Education (FARE): foodallergy.org 3. Mothers of Asthmatics: http://www.asthmacommunitynetwork.org 4. American College of Allergy, Asthma, and Immunology: www.acaai.org

## 2023-10-06 NOTE — Addendum Note (Signed)
 Addended by: Minor Amble on: 10/06/2023 04:30 PM   Modules accepted: Orders

## 2023-10-08 LAB — ALPHA-GAL PANEL
Allergen Lamb IgE: 0.11 kU/L — AB
Beef IgE: 0.14 kU/L — AB
IgE (Immunoglobulin E), Serum: 14 [IU]/mL (ref 6–495)
O215-IgE Alpha-Gal: 0.22 kU/L — AB
Pork IgE: <0.1 kU/L

## 2023-10-09 ENCOUNTER — Ambulatory Visit: Payer: Self-pay | Admitting: Family

## 2023-10-09 NOTE — Progress Notes (Signed)
 Please let Tyler Hawkins know that his lab work for alpha gal has decreased and if interested we could try an in office oral food challenge to beef. This challenge would last 6 hours and would need to be scheduled at 8:30 AM. He would need to bring one hamburger patty with him the day of the challenge. He will also need to be off all antihistamines 3 days prior to this appointment and in good health (no recent vaccines or antibiotics 7 days prior to this appointment) If he is not interested in this challenge, he will need to  continue to avoid all red meat and have access to his epinephrine  auto injector device at all times.  Sending to Dr. Idolina Maker, his primary allergist, to keep him in the loop.

## 2024-02-21 ENCOUNTER — Encounter: Payer: Self-pay | Admitting: Allergy & Immunology

## 2024-02-24 MED ORDER — XHANCE 93 MCG/ACT NA EXHU: 2.0000 | INHALANT_SUSPENSION | Freq: Every day | NASAL | 5 refills | Status: DC

## 2024-02-24 MED ORDER — XHANCE 93 MCG/ACT NA EXHU: 2.0000 | INHALANT_SUSPENSION | Freq: Every day | NASAL | 5 refills | Status: AC

## 2024-02-24 NOTE — Telephone Encounter (Signed)
 Ok to send in a prescription for Xhance nasal spray 2 sprays in each nostril once a day as needed for stuffy nose

## 2024-03-31 ENCOUNTER — Emergency Department (HOSPITAL_BASED_OUTPATIENT_CLINIC_OR_DEPARTMENT_OTHER): Admitting: Radiology

## 2024-03-31 ENCOUNTER — Emergency Department (HOSPITAL_BASED_OUTPATIENT_CLINIC_OR_DEPARTMENT_OTHER)
Admission: EM | Admit: 2024-03-31 | Discharge: 2024-03-31 | Disposition: A | Discharge status: Home / Self Care | Attending: Emergency Medicine | Admitting: Emergency Medicine

## 2024-03-31 ENCOUNTER — Encounter (HOSPITAL_BASED_OUTPATIENT_CLINIC_OR_DEPARTMENT_OTHER): Payer: Self-pay

## 2024-03-31 ENCOUNTER — Other Ambulatory Visit: Payer: Self-pay

## 2024-03-31 DIAGNOSIS — I10 Essential (primary) hypertension: Secondary | ICD-10-CM | POA: Insufficient documentation

## 2024-03-31 DIAGNOSIS — R072 Precordial pain: Secondary | ICD-10-CM | POA: Diagnosis not present

## 2024-03-31 DIAGNOSIS — R079 Chest pain, unspecified: Secondary | ICD-10-CM | POA: Diagnosis present

## 2024-03-31 LAB — BASIC METABOLIC PANEL WITH GFR
Anion gap: 10 (ref 5–15)
BUN: 15 mg/dL (ref 6–20)
CO2: 28 mmol/L (ref 22–32)
Calcium: 9.8 mg/dL (ref 8.9–10.3)
Chloride: 101 mmol/L (ref 98–111)
Creatinine, Ser: 1 mg/dL (ref 0.61–1.24)
GFR, Estimated: >60 mL/min (ref >60)
Glucose, Bld: 128 mg/dL — ABNORMAL HIGH (ref 70–99)
Potassium: 4 mmol/L (ref 3.5–5.1)
Sodium: 139 mmol/L (ref 135–145)

## 2024-03-31 LAB — TROPONIN T, HIGH SENSITIVITY
Troponin T High Sensitivity: <15 ng/L (ref 0–19)
Troponin T High Sensitivity: <15 ng/L (ref 0–19)

## 2024-03-31 LAB — CBC
HCT: 42 % (ref 39.0–52.0)
Hemoglobin: 14.3 g/dL (ref 13.0–17.0)
MCH: 29.9 pg (ref 26.0–34.0)
MCHC: 34 g/dL (ref 30.0–36.0)
MCV: 87.7 fL (ref 80.0–100.0)
Platelets: 223 K/uL (ref 150–400)
RBC: 4.79 MIL/uL (ref 4.22–5.81)
RDW: 12.2 % (ref 11.5–15.5)
WBC: 5.9 K/uL (ref 4.0–10.5)
nRBC: 0 % (ref 0.0–0.2)

## 2024-03-31 NOTE — ED Triage Notes (Signed)
 Patient reports chest pain about 30 minutes ago, said it felt like a pop, and felt a little faint. Reports his home BP was 163/101 and was increasing. Patient reports no cardiac hx but does have cardiologist due to Alpha -gal. Said his cardiac scores when he had testing was was low risk.   Denies no radiation to arm, jaw or back. Says his neck is stiff, and he has a tingling sensation in his sternum.

## 2024-03-31 NOTE — Discharge Instructions (Signed)
 Please read and follow all provided instructions.  Your diagnoses today include:  1. Precordial pain     Tests performed today include: An EKG of your heart: Is stable A chest x-ray: Was normal Cardiac enzymes - a blood test for heart muscle damage Blood counts and electrolytes Vital signs. See below for your results today.   Medications prescribed:  None  Take any prescribed medications only as directed.  Follow-up instructions: Please follow-up with your primary care provider as needed for further evaluation of your symptoms.   Return instructions:  SEEK IMMEDIATE MEDICAL ATTENTION IF: You have severe chest pain, especially if the pain is crushing or pressure-like and spreads to the arms, back, neck, or jaw, or if you have sweating, nausea or vomiting, or trouble with breathing. THIS IS AN EMERGENCY. Do not wait to see if the pain will go away. Get medical help at once. Call 911. DO NOT drive yourself to the hospital.  Your chest pain gets worse and does not go away after a few minutes of rest.  You have an attack of chest pain lasting longer than what you usually experience.  You have significant dizziness, if you pass out, or have trouble walking.  You have chest pain not typical of your usual pain for which you originally saw your caregiver.  You have any other emergent concerns regarding your health.  Additional Information: Chest pain comes from many different causes. Your caregiver has diagnosed you as having chest pain that is not specific for one problem, but does not require admission.  You are at low risk for an acute heart condition or other serious illness.   Your vital signs today were: BP (!) 142/106   Pulse 77   Temp 97.9 F (36.6 C) (Oral)   Resp 11   SpO2 99%  If your blood pressure (BP) was elevated above 135/85 this visit, please have this repeated by your doctor within one month. --------------

## 2024-03-31 NOTE — ED Provider Notes (Signed)
 [place] [facility] DEPARTMENT AT [facility] Provider Note   CSN: [id number] Arrival date & time: 03/31/24  1214     Patient presents with: Chest Pain   Tyler Hawkins is a 53 y.o. male.   Patient with history of hypertension, alpha gal/Lyme --presents emergency department for evaluation of chest pain.  Patient was sitting at his desk working, not doing anything exertional, around 11 AM when he developed a sharp pain in his mid chest.  He states it felt like the air was sucked out of me.  His symptoms only lasted for a few seconds and then he recovered.  He did have a little bit of soreness in around his neck area.  He checked his blood pressure and noted it to be high.  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  This prompted emergency department evaluation.  Patient has not had symptoms like this in the past.  No associated sweating, diaphoresis, lightheadedness.  No vomiting. Patient denies risk factors for pulmonary embolism including: unilateral leg swelling, history of DVT/PE/other blood clots, use of exogenous hormones, recent immobilizations, recent surgery, recent travel (>4hr segment), malignancy, hemoptysis.   Patient has had cardiac workup, most recent cardiac CT results below.  He does not know family history on his father side of the family.  No history of heart disease on mother side.  Patient does not smoke.  He states that he has been monitoring his health very closely, blood pressure is normally runs 120-140 systolic range.   11/2023: Minimal coronary atherosclerosis of the proximal LAD with less than 25% stenosis. Calcium score is 1, which is at the 53rd percentile.  Recommend lifestyle modification and pharmacological therapy.         Prior to Admission medications   Medication Sig Start Date End Date Taking? Authorizing Provider  albuterol  (VENTOLIN  HFA) 108 (90 Base)  MCG/ACT inhaler Inhale 2 puffs into the lungs as needed. 10/06/23   Cheryl Reusing, FNP  amLODipine (NORVASC) 2.5 MG tablet Take 2.5 mg by mouth daily. Patient not taking: Reported on 10/06/2023    [provider]  budesonide -formoterol  (SYMBICORT ) 80-4.5 MCG/ACT inhaler Inhale 2 puffs into the lungs as needed. Patient not taking: Reported on 10/06/2023 12/24/21   Iva Marty Saltness, MD  EPINEPHrine  0.3 mg/0.3 mL IJ SOAJ injection Inject 0.3 mg into the muscle as needed for anaphylaxis. 10/06/23   Cheryl Reusing, FNP  Fluticasone Propionate (XHANCE) 93 MCG/ACT EXHU Place 2 sprays into the nose daily. 02/24/24   Iva Marty Saltness, MD  metoprolol  tartrate (LOPRESSOR ) 25 MG tablet Take 1 tablet (25 mg total) by mouth 2 (two) times daily as needed. Patient not taking: Reported on 10/06/2023 05/27/23   Jeffrie Oneil JAYSON, MD    Allergies: Alpha-gal and Gelatin    Review of Systems  Updated Vital Signs BP (!) 169/115 (BP Location: Right Arm)   Pulse 87   Temp 97.9 F (36.6 C) (Oral)   Resp 20   SpO2 100%   Physical Exam Vitals and nursing note reviewed.  Constitutional:      Appearance: He is well-developed. He is not diaphoretic.  HENT:     Head: Normocephalic and atraumatic.     Mouth/Throat:     Mouth: Mucous membranes are not dry.  Eyes:     Conjunctiva/sclera: Conjunctivae normal.  Neck:     Vascular: Normal carotid pulses. No carotid bruit or JVD.  Trachea: Trachea normal. No tracheal deviation.  Cardiovascular:     Rate and Rhythm: Normal rate and regular rhythm.     Pulses: No decreased pulses.          Radial pulses are 2+ on the right side and 2+ on the left side.     Heart sounds: Normal heart sounds, S1 normal and S2 normal. Heart sounds not distant. No murmur heard. Pulmonary:     Effort: Pulmonary effort is normal. No respiratory distress.     Breath sounds: Normal breath sounds. No wheezing.  Chest:     Chest wall: No tenderness.  Abdominal:     General:  Bowel sounds are normal.     Palpations: Abdomen is soft.     Tenderness: There is no abdominal tenderness. There is no guarding or rebound.  Musculoskeletal:     Cervical back: Normal range of motion and neck supple. No muscular tenderness.     Right lower leg: No edema.     Left lower leg: No edema.  Skin:    General: Skin is warm and dry.     Coloration: Skin is not pale.  Neurological:     Mental Status: He is alert. Mental status is at baseline.  Psychiatric:        Mood and Affect: Mood normal.     (all labs ordered are listed, but only abnormal results are displayed) Labs Reviewed  BASIC METABOLIC PANEL WITH GFR - Abnormal; Notable for the following components:      Result Value   Glucose, Bld 128 (*)    All other components within normal limits  CBC  TROPONIN T, HIGH SENSITIVITY  TROPONIN T, HIGH SENSITIVITY    ED ECG REPORT   Date: 03/31/2024  Rate: 77  Rhythm: normal sinus rhythm  QRS Axis: left  Intervals: normal  ST/T Wave abnormalities: normal  Conduction Disutrbances:left anterior fascicular block  Narrative Interpretation:   Old EKG Reviewed: unchanged  I have personally reviewed the EKG tracing and agree with the computerized printout as noted.   Radiology: DG Chest 2 View Result Date: 03/31/2024 EXAM: 2 VIEW(S) XRAY OF THE CHEST 03/31/2024 01:10:52 PM COMPARISON: 04/17/2021 CLINICAL HISTORY: cp cp FINDINGS: LUNGS AND PLEURA: No focal pulmonary opacity. No pleural effusion. No pneumothorax. HEART AND MEDIASTINUM: No acute abnormality of the cardiac and mediastinal silhouettes. BONES AND SOFT TISSUES: No acute osseous abnormality. IMPRESSION: 1. No acute cardiopulmonary disease. Electronically signed by: Lynwood Seip MD 03/31/2024 01:51 PM EST RP Workstation: [id number]     Procedures   Medications Ordered in the ED - No data to display  ED Course  Patient seen and examined. History obtained directly from patient.  Reviewed previous cardiac  imaging results in epic and Care Everywhere.  Labs/EKG: Ordered CBC, BMP, troponin.  Imaging: Ordered chest x-ray.  Medications/Fluids: None ordered  Most recent vital signs reviewed and are as follows: BP (!) 169/115 (BP Location: Right Arm)   Pulse 87   Temp 97.9 F (36.6 C) (Oral)   Resp 20   SpO2 100%   Initial impression: Atypical chest pain, previous cardiac workup reassuring.  No significant risk factors for DVT/PE and symptoms to be atypical.  Patient is currently at baseline, no pain.  Will check to troponins and reassess.  3:30 PM Reassessment performed. Patient appears stable, comfortable.  Labs personally reviewed and interpreted including: CBC unremarkable; BMP glucose 128 otherwise unremarkable; troponin less than 15.  Second opponent remains negative.   Imaging personally visualized  and interpreted including: Chest x-ray agree negative.  Reviewed pertinent lab work and imaging with patient at bedside. Questions answered.   Most current vital signs reviewed and are as follows: BP (!) 142/106   Pulse 77   Temp 97.9 F (36.6 C) (Oral)   Resp 11   SpO2 99%   Plan: Discharge to home.   Prescriptions written for: None  Return and follow-up instructions: I encouraged patient to return to ED with severe chest pain, especially if the pain is crushing or pressure-like and spreads to the arms, back, neck, or jaw, or if they have associated sweating, vomiting, or shortness of breath with the pain, or significant pain with activity. We discussed that the evaluation here today indicates a low-risk of serious cause of chest pain, including heart trouble or a blood clot, but no evaluation is perfect and chest pain can evolve with time. The patient verbalized understanding and agreed.  I encouraged patient to follow-up with their provider as needed for recurrent symptoms.                                      Medical Decision Making Amount and/or Complexity of Data  Reviewed Labs: ordered. Radiology: ordered.   For this patient's complaint of chest pain, the following emergent conditions were considered on the differential diagnosis: acute coronary syndrome, pulmonary embolism, pneumothorax, myocarditis, pericardial tamponade, aortic dissection, thoracic aortic aneurysm complication, esophageal perforation.   Other causes were also considered including: gastroesophageal reflux disease, musculoskeletal pain including costochondritis, pneumonia/pleurisy, herpes zoster, pericarditis.  In regards to possibility of ACS, patient has atypical features of pain, non-ischemic and unchanged EKG and negative troponin(s). Heart score was calculated to be 3.  Previous cardiac studies reassuring.  In regards to possibility of PE, symptoms are atypical for PE and risk profile is low, making PE low likelihood.  The patient's vital signs, pertinent lab work and imaging were reviewed and interpreted as discussed in the ED course. Hospitalization was considered for further testing, treatments, or serial exams/observation. However as patient is well-appearing, has a stable exam, and reassuring studies today, I do not feel that they warrant admission at this time. This plan was discussed with the patient who verbalizes agreement and comfort with this plan and seems reliable and able to return to the Emergency Department with worsening or changing symptoms.       Final diagnoses:  Precordial pain    ED Discharge Orders     None          Desiderio Chew, PA-C 03/31/24 1532    Emil Share, DO 04/01/24 [phone]

## 2024-03-31 NOTE — ED Notes (Signed)
 ED Provider at bedside.

## 2024-03-31 NOTE — ED Notes (Signed)

## 2024-04-07 ENCOUNTER — Ambulatory Visit: Admitting: Allergy & Immunology
